# Patient Record
Sex: Male | Born: 1945 | Race: Black or African American | Hispanic: No | Marital: Married | Smoking: Former smoker
Health system: Southern US, Community
[De-identification: ages and names within clinical notes are randomized; demographics above are authoritative.]

## PROBLEM LIST (undated history)

## (undated) DIAGNOSIS — B1920 Unspecified viral hepatitis C without hepatic coma: Secondary | ICD-10-CM

## (undated) DIAGNOSIS — I639 Cerebral infarction, unspecified: Secondary | ICD-10-CM

## (undated) DIAGNOSIS — I10 Essential (primary) hypertension: Secondary | ICD-10-CM

## (undated) DIAGNOSIS — C61 Malignant neoplasm of prostate: Secondary | ICD-10-CM

## (undated) DIAGNOSIS — E785 Hyperlipidemia, unspecified: Secondary | ICD-10-CM

## (undated) DIAGNOSIS — M1990 Unspecified osteoarthritis, unspecified site: Secondary | ICD-10-CM

## (undated) HISTORY — DX: Essential (primary) hypertension: I10

## (undated) HISTORY — DX: Unspecified viral hepatitis C without hepatic coma: B19.20

## (undated) HISTORY — DX: Cerebral infarction, unspecified: I63.9

## (undated) HISTORY — DX: Hyperlipidemia, unspecified: E78.5

## (undated) HISTORY — DX: Unspecified osteoarthritis, unspecified site: M19.90

---

## 2008-01-14 ENCOUNTER — Emergency Department: Payer: Self-pay | Admitting: Emergency Medicine

## 2010-05-19 ENCOUNTER — Ambulatory Visit: Payer: Self-pay | Admitting: Internal Medicine

## 2011-06-22 ENCOUNTER — Ambulatory Visit: Payer: Self-pay | Admitting: Gastroenterology

## 2011-06-24 LAB — PATHOLOGY REPORT

## 2011-11-18 ENCOUNTER — Ambulatory Visit: Payer: Self-pay | Admitting: Physical Medicine and Rehabilitation

## 2014-06-18 DIAGNOSIS — M5416 Radiculopathy, lumbar region: Secondary | ICD-10-CM | POA: Insufficient documentation

## 2014-06-19 DIAGNOSIS — M5136 Other intervertebral disc degeneration, lumbar region: Secondary | ICD-10-CM | POA: Insufficient documentation

## 2015-12-20 DIAGNOSIS — I639 Cerebral infarction, unspecified: Secondary | ICD-10-CM | POA: Insufficient documentation

## 2015-12-20 DIAGNOSIS — I10 Essential (primary) hypertension: Secondary | ICD-10-CM | POA: Insufficient documentation

## 2015-12-27 ENCOUNTER — Other Ambulatory Visit: Payer: Self-pay | Admitting: Family Medicine

## 2015-12-27 DIAGNOSIS — I639 Cerebral infarction, unspecified: Secondary | ICD-10-CM

## 2016-01-06 ENCOUNTER — Ambulatory Visit: Payer: Medicare Other | Attending: Physical Medicine and Rehabilitation | Admitting: Physical Therapy

## 2016-01-06 ENCOUNTER — Encounter: Payer: Self-pay | Admitting: Physical Therapy

## 2016-01-06 DIAGNOSIS — M6281 Muscle weakness (generalized): Secondary | ICD-10-CM | POA: Insufficient documentation

## 2016-01-06 NOTE — Therapy (Signed)
[place] MAIN [facility] SERVICES [address] [place], [place], [address] Phone: [phone]   Fax:  [phone]  Physical Therapy Evaluation  Patient Details  Name: Francisco Lowe MRN: [id number] Date of Birth: November 25, 1945 Referring Provider: Dr. Gwyneth Revels  Encounter Date: 01/06/2016      PT End of Session - 01/06/16 1653    Visit Number 1   Number of Visits 25   Date for PT Re-Evaluation 2016-04-02   Authorization Type g codes   PT Start Time 0445   PT Stop Time 0530   PT Time Calculation (min) 45 min   Equipment Utilized During Treatment Gait belt      No past medical history on file.  No past surgical history on file.  There were no vitals filed for this visit.  Visit Diagnosis:  Muscle weakness (generalized)      Subjective Assessment - 01/06/16 1649    Subjective Patient is feeling somewhat dizzy and he is not having trouble with walking.  Left arm is numb.    Currently in Pain? No/denies            [facility] PT Assessment - 01/06/16 0001    Assessment   Medical Diagnosis cva   Referring Provider Dr. Gwyneth Revels   Onset Date/Surgical Date 12/25/15   Hand Dominance Right   Precautions   Precaution Comments hospital   Restrictions   Weight Bearing Restrictions No   Balance Screen   Has the patient fallen in the past 6 months No   Has the patient had a decrease in activity level because of a fear of falling?  Yes   Is the patient reluctant to leave their home because of a fear of falling?  No   Home Environment   Living Environment Private residence   Living Arrangements Spouse/significant other   Available Help at Discharge Family   Type of [place] to enter   Entrance Stairs-Number of Steps 5   Entrance Stairs-Rails Can reach both;Left;Right   Home Layout One level   [place] None   Prior Function   Level of Independence Independent;Independent with homemaking with ambulation   Vocation Retired       PAIN:  no reports of pain  POSTURE: WNL PROM/AROM: WFL  STRENGTH:  Graded on a 0-5 scale Muscle Group Left Right  Shoulder flex    Shoulder Abd    Shoulder Ext    Shoulder IR/ER    Elbow    Wrist/hand    Hip Flex 4 5  Hip Abd 4 5  Hip Add 4 5  Hip Ext 4 5  Hip IR/ER 4 5  Knee Flex 4 5  Knee Ext 4 5  Ankle DF 4 5  Ankle PF 4 5   SENSATION: LUE numbness   FUNCTIONAL MOBILITY: independent   BALANCE: Unable to tandem stand or single leg stand GAIT: Ambulates without AD with forward lean gait and uneven path  OUTCOME MEASURES: TEST Outcome Interpretation  5 times sit<>stand 20.90sec >60 yo, >15 sec indicates increased risk for falls  10 meter walk test  .9 0               m/s <1.0 m/s indicates increased risk for falls; limited community ambulator  Timed up and Go 14.22                sec <14 sec indicates increased risk for falls  6 minute walk test  900         Feet 1000 feet is community ambulator                                 PT Education - 02-02-16 1652    Education provided Yes   Education Details plan of care   Person(s) Educated Patient   Methods Explanation   Comprehension Verbalized understanding             PT Long Term Goals - 2016/02/02 1705    PT LONG TERM GOAL #1   Title Patient will tolerate 5 seconds of single leg stance without loss of balance to improve ability to get in and out of shower safely   Time 12   Period Weeks   Status New   PT LONG TERM GOAL #2   Title Patient will increase 10 meter walk test to >1.49m/s as to improve gait speed for better community ambulation and to reduce fall risk   Time 12   Period Weeks   Status New   PT LONG TERM GOAL #3   Title Patient will reduce timed up and go to <11 seconds to reduce fall risk and demonstrate improved transfer/gait ability   Time 12   Status New   PT LONG TERM GOAL #4   Title Patient will increase six minute walk test distance  to >1000 for progression to community ambulator and improve gait ability   Time 12   Period Weeks   Status New               Plan - 02-Feb-2016 1653    Clinical Impression Statement Patient has LE weakness and decreased ambulation for long distances, and decreased dynamic standing balance. He will benefit from skilled PT to improve dynamic stanidng balance and strength.    Pt will benefit from skilled therapeutic intervention in order to improve on the following deficits Decreased safety awareness;Abnormal gait;Decreased strength;Decreased balance   Rehab Potential Good   PT Frequency 2x / week   PT Duration 12 weeks   PT Treatment/Interventions Therapeutic exercise;Therapeutic activities;Gait training;Balance training   PT Next Visit Plan balance   Consulted and Agree with Plan of Care Patient          G-Codes - Feb 02, 2016 1709    Functional Assessment Tool Used 6 mw, TUG, 10 MW, 5 x sit to stand   Functional Limitation Mobility: Walking and moving around   Mobility: Walking and Moving Around Current Status [phone]) At least 20 percent but less than 40 percent impaired, limited or restricted   Mobility: Walking and Moving Around Goal Status [phone]) At least 1 percent but less than 20 percent impaired, limited or restricted       Problem List There are no active problems to display for this patient.  Alanson Puls, PT, DPT [place], [place] S 02/02/16, 5:15 PM  [place] MAIN [facility] SERVICES [address] [place], [place], [address] Phone: [phone]   Fax:  [phone]  Name: Francisco Lowe MRN: [id number] Date of Birth: 1945/12/29

## 2016-01-08 ENCOUNTER — Encounter: Payer: Self-pay | Admitting: Physical Therapy

## 2016-01-08 ENCOUNTER — Ambulatory Visit: Payer: Medicare Other | Admitting: Physical Therapy

## 2016-01-08 DIAGNOSIS — M6281 Muscle weakness (generalized): Secondary | ICD-10-CM | POA: Diagnosis not present

## 2016-01-08 NOTE — Therapy (Signed)
[place] MAIN [facility] SERVICES [address] [place], [place], [address] Phone: [phone]   Fax:  [phone]  Physical Therapy Treatment  Patient Details  Name: Francisco Lowe MRN: [id number] Date of Birth: 01-02-1946 Referring Provider: Dr. Gwyneth Revels  Encounter Date: 01/08/2016      PT End of Session - 01/08/16 1016    Visit Number 2   Number of Visits 25   Date for PT Re-Evaluation Apr 24, 2016   Authorization Type g codes   PT Start Time 01/30/1004   PT Stop Time 1045   PT Time Calculation (min) 40 min   Equipment Utilized During Treatment Gait belt      History reviewed. No pertinent past medical history.  History reviewed. No pertinent past surgical history.  There were no vitals filed for this visit.  Visit Diagnosis:  Muscle weakness (generalized)      Subjective Assessment - 01/08/16 1016    Subjective Patient is feeling somewhat dizzy and he is not having trouble with walking.  Left arm is numb.    Currently in Pain? No/denies     Therapeutic exercise: TM walking side stepping left and right x 5 minutes at . 7 miles/ hour Leg press 120 lbs x 15 x 3, ankle PF on leg press x 15 x 3 UBE x 5 minutes L 4 Side steps x 3 laps   Mini squat 2x10 Standing hip abd 2x10 Standing ankle PF/ DF 2x20 Patient needs occasional verbal cueing to improve posture and cueing to correctly perform exercises slowly, holding at end of range to increase motor firing of desired muscle to encourage fatigue.                            PT Education - 01/08/16 1016    Education provided Yes   Education Details plan of care   Person(s) Educated Patient   Methods Explanation   Comprehension Verbalized understanding             PT Long Term Goals - 01/06/16 1705    PT LONG TERM GOAL #1   Title Patient will tolerate 5 seconds of single leg stance without loss of balance to improve ability to get in and out of shower safely    Time 12   Period Weeks   Status New   PT LONG TERM GOAL #2   Title Patient will increase 10 meter walk test to >1.18m/s as to improve gait speed for better community ambulation and to reduce fall risk   Time 12   Period Weeks   Status New   PT LONG TERM GOAL #3   Title Patient will reduce timed up and go to <11 seconds to reduce fall risk and demonstrate improved transfer/gait ability   Time 12   Status New   PT LONG TERM GOAL #4   Title Patient will increase six minute walk test distance to >1000 for progression to community ambulator and improve gait ability   Time 12   Period Weeks   Status New               Plan - 01/08/16 1017    Clinical Impression Statement Patient is able to perform UE/LE exercises without pain behaviors and reports some fatigue.    Pt will benefit from skilled therapeutic intervention in order to improve on the following deficits Decreased safety awareness;Abnormal gait;Decreased strength;Decreased balance   Rehab Potential Good  PT Frequency 2x / week   PT Duration 12 weeks   PT Treatment/Interventions Therapeutic exercise;Therapeutic activities;Gait training;Balance training   PT Next Visit Plan balance   Consulted and Agree with Plan of Care Patient        Problem List There are no active problems to display for this patient. Alanson Puls, PT, DPT  [place], [place] S 01/08/2016, 10:19 AM  [place] MAIN [facility] SERVICES [address] [place], [place], [address] Phone: [phone]   Fax:  [phone]  Name: ROREY RAUF MRN: [id number] Date of Birth: 04-09-1946

## 2016-01-13 ENCOUNTER — Ambulatory Visit: Payer: Medicare Other | Admitting: Physical Therapy

## 2016-01-13 ENCOUNTER — Encounter: Payer: Self-pay | Admitting: Physical Therapy

## 2016-01-13 DIAGNOSIS — M6281 Muscle weakness (generalized): Secondary | ICD-10-CM

## 2016-01-13 NOTE — Therapy (Signed)
[place] MAIN [facility] SERVICES [address] [place], [place], [address] Phone: [phone]   Fax:  [phone]  Physical Therapy Treatment  Patient Details  Name: Francisco Lowe MRN: [id number] Date of Birth: Dec 26, 1945 Referring Provider: Dr. Gwyneth Revels  Encounter Date: 01/13/2016      PT End of Session - 01/13/16 1449    Visit Number 3   Number of Visits 25   Date for PT Re-Evaluation Apr 09, 2016   Authorization Type g codes   PT Start Time [id number]   PT Stop Time 1424   PT Time Calculation (min) 28 min   Equipment Utilized During Treatment Gait belt      History reviewed. No pertinent past medical history.  History reviewed. No pertinent past surgical history.  There were no vitals filed for this visit.      Subjective Assessment - 01/13/16 1358    Subjective Pt reports he has been doing well. His main issue continues to be the L leg numbness and weakness. He has had no falls since last visit. He went to his doctor last week and reported he doesn't like how his leg cramp medicine makes him feel dizzy. MD changed the medication to taking it at night. Pt says symptoms are better but not quick right.   Currently in Pain? No/denies      Warm up: Nustep L4 x 3 minutes (unbilled)  Balance: Ladder walk: forwards, L/R x2 each without UE support with CGA Step up/down onto BOSU ball (blue side) without UE support with min A Cues for increased BOS, postural correction and weight shifting  Therex:  Standing hip flexion, abd, ext with 10# 2x10 L LE Heel raises x 20 Mini squats x 15 Cues for proper technique of each exercises and positioning.                           PT Education - 01/13/16 1429    Education provided Yes   Education Details Importance of strengthening to improve balance   Person(s) Educated Patient   Methods Explanation   Comprehension Verbalized understanding             PT Long Term  Goals - 01/06/16 1705    PT LONG TERM GOAL #1   Title Patient will tolerate 5 seconds of single leg stance without loss of balance to improve ability to get in and out of shower safely   Time 12   Period Weeks   Status New   PT LONG TERM GOAL #2   Title Patient will increase 10 meter walk test to >1.53m/s as to improve gait speed for better community ambulation and to reduce fall risk   Time 12   Period Weeks   Status New   PT LONG TERM GOAL #3   Title Patient will reduce timed up and go to <11 seconds to reduce fall risk and demonstrate improved transfer/gait ability   Time 12   Status New   PT LONG TERM GOAL #4   Title Patient will increase six minute walk test distance to >1000 for progression to community ambulator and improve gait ability   Time 12   Period Weeks   Status New               Plan - 01/13/16 1452    Clinical Impression Statement Pt required CGA to min A during dynamic balance activities but was able to improve stability with repetition  and cues. Lower level balance activities completed with minimal difficulty, requiring higher level balance activities. L LE fatigues with strengthening exercises and required occasional rest breaks for energy conservation. He will benefit from continued strengthening and higher level balance tasks to progress towards PLOF.   Rehab Potential Good   PT Frequency 2x / week   PT Duration 12 weeks   PT Treatment/Interventions Therapeutic exercise;Therapeutic activities;Gait training;Balance training   PT Next Visit Plan increase LE strengthening, high level balance   Consulted and Agree with Plan of Care Patient      Patient will benefit from skilled therapeutic intervention in order to improve the following deficits and impairments:  Decreased safety awareness, Abnormal gait, Decreased strength, Decreased balance  Visit Diagnosis: Muscle weakness (generalized)     Problem List There are no active problems to display for  this patient.   Harvis Mabus Shiela Mayer, PT, DPT  01/13/2016, 3:30 PM [place] MAIN [facility] SERVICES [address] [place], [place], [address] Phone: [phone]   Fax:  [phone]  Name: Francisco Lowe MRN: [id number] Date of Birth: November 27, 1945

## 2016-01-15 ENCOUNTER — Encounter: Payer: Self-pay | Admitting: Physical Therapy

## 2016-01-15 ENCOUNTER — Ambulatory Visit: Payer: Medicare Other | Admitting: Physical Therapy

## 2016-01-15 DIAGNOSIS — M6281 Muscle weakness (generalized): Secondary | ICD-10-CM

## 2016-01-15 NOTE — Therapy (Addendum)
[place] MAIN [facility] SERVICES [address] [place], [place], [address] Phone: [phone]   Fax:  [phone]  Physical Therapy Treatment  Patient Details  Name: Francisco Lowe MRN: [id number] Date of Birth: 10/14/1945 Referring Provider: Dr. Gwyneth Revels  Encounter Date: 01/15/2016      PT End of Session - 01/15/16 1004    Visit Number 4   Number of Visits 25   Date for PT Re-Evaluation 2016/04/14   Authorization Type g codes   Authorization Time Period 4/10   PT Start Time 0920   PT Stop Time 1000   PT Time Calculation (min) 40 min   Equipment Utilized During Treatment Gait belt   Activity Tolerance Patient tolerated treatment well   Behavior During Therapy [facility] for tasks assessed/performed      History reviewed. No pertinent past medical history.  History reviewed. No pertinent past surgical history.  There were no vitals filed for this visit.      Subjective Assessment - 01/15/16 0926    Subjective Pt reports his dizziness has gotten better after MD adjusted his medication. L leg continues to have numbness.   Currently in Pain? No/denies     Therex: TM speed 1.5 x 5 min with frequent cues for postural correction including bringing hips forward underneath shoulders for upright stance. Pt able to correct with cues somewhat and reports tightness in anterior hips. + B Thomas test Standing hip flexor stretch in chair x30 sec each Prone hip flexor stretch with belt 3x30 sec each Standing hip abd with YTB L LE 3x10 Cues for proper technique of exercises and postural correction.   Neuro re-education SLS in corner 3x30 sec each with/without UE support OTIS on airex with YTB 3x20 sec R LE, task was too difficult for L LE L LE SLS without UE support 3x20 sec, attempted on airex and was too difficult at this time Cues for weight shifting, postural correction and glute contraction for increased stability. Pt required min A for balance  activities and had multiple LOB without self correction when in SLS.                            PT Education - 01/15/16 1003    Education provided Yes   Education Details HEP; role of hip muscles for stability and balance in SLS tasks   Person(s) Educated Patient   Methods Explanation;Demonstration;Tactile cues;Verbal cues;Handout   Comprehension Verbalized understanding;Returned demonstration             PT Long Term Goals - 01/06/16 1705    PT LONG TERM GOAL #1   Title Patient will tolerate 5 seconds of single leg stance without loss of balance to improve ability to get in and out of shower safely   Time 12   Period Weeks   Status New   PT LONG TERM GOAL #2   Title Patient will increase 10 meter walk test to >1.34m/s as to improve gait speed for better community ambulation and to reduce fall risk   Time 12   Period Weeks   Status New   PT LONG TERM GOAL #3   Title Patient will reduce timed up and go to <11 seconds to reduce fall risk and demonstrate improved transfer/gait ability   Time 12   Status New   PT LONG TERM GOAL #4   Title Patient will increase six minute walk test distance to >1000 for progression  to community ambulator and improve gait ability   Time 12   Period Weeks   Status New               Plan - 01/15/16 1005    Clinical Impression Statement Pt demonstrated tight hip flexors causing him to have a flexed posture. He also has significant L hip abductor weakness causing trendelenburg when in SLS on L LE. He will benefit from continued hip flexor stretching, hip strengthening and SLS balance tasks.   Rehab Potential Good   PT Frequency 2x / week   PT Duration 12 weeks   PT Treatment/Interventions Therapeutic exercise;Therapeutic activities;Gait training;Balance training   PT Next Visit Plan hip strengthening, SLS tasks, hip flexor stretching   PT Home Exercise Plan SLS in corner, hip flexor stretch   Consulted and Agree with  Plan of Care Patient      Patient will benefit from skilled therapeutic intervention in order to improve the following deficits and impairments:  Decreased safety awareness, Abnormal gait, Decreased strength, Decreased balance  Visit Diagnosis: Muscle weakness (generalized)     Problem List There are no active problems to display for this patient.   Alyah Boehning Shiela Mayer, PT, DPT  01/15/2016, 10:16 AM [phone]   [place] MAIN [facility] SERVICES [address] [place], [place], [address] Phone: [phone]   Fax:  [phone]  Name: Francisco Lowe MRN: [id number] Date of Birth: June 04, 1946

## 2016-01-15 NOTE — Patient Instructions (Signed)
Provided handout and reviewed HEP for hip flexor stretching and balance from [email or web address].com: prone hip flexor stretch with belt (or) standing kneeling in chair stretch, and corner SLS.

## 2016-01-20 ENCOUNTER — Ambulatory Visit
Admission: RE | Admit: 2016-01-20 | Discharge: 2016-01-20 | Disposition: A | Discharge status: Home / Self Care | Payer: Medicare Other | Source: Ambulatory Visit | Attending: Family Medicine | Admitting: Family Medicine

## 2016-01-20 DIAGNOSIS — I6782 Cerebral ischemia: Secondary | ICD-10-CM | POA: Diagnosis not present

## 2016-01-20 DIAGNOSIS — I639 Cerebral infarction, unspecified: Secondary | ICD-10-CM | POA: Insufficient documentation

## 2016-01-20 MED ORDER — GADOBENATE DIMEGLUMINE 529 MG/ML IV SOLN
20.0000 mL | Freq: Once | INTRAVENOUS | Status: AC | PRN
  Administered 2016-01-20: 16 mL via INTRAVENOUS

## 2016-01-21 ENCOUNTER — Ambulatory Visit: Payer: Medicare Other

## 2016-01-21 DIAGNOSIS — M6281 Muscle weakness (generalized): Secondary | ICD-10-CM | POA: Diagnosis not present

## 2016-01-21 NOTE — Therapy (Signed)
[place] MAIN [facility] SERVICES [address] [place], [place], [address] Phone: [phone]   Fax:  [phone]  Physical Therapy Treatment  Patient Details  Name: Francisco Lowe MRN: [id number] Date of Birth: 10-15-1945 Referring Provider: Dr. Gwyneth Revels  Encounter Date: 01/21/2016      PT End of Session - 01/21/16 1609    Visit Number 5   Number of Visits 25   Date for PT Re-Evaluation 2016/04/19   Authorization Type g codes   Authorization Time Period 5/10   PT Start Time 1604   PT Stop Time 1645   PT Time Calculation (min) 41 min   Equipment Utilized During Treatment Gait belt   Activity Tolerance Patient tolerated treatment well   Behavior During Therapy [facility] for tasks assessed/performed      History reviewed. No pertinent past medical history.  History reviewed. No pertinent past surgical history.  There were no vitals filed for this visit.      Subjective Assessment - 01/21/16 1609    Subjective pt reports he felt ok after last visit. he reports he has been sleepy   Patient Stated Goals improve mobility/balance    Currently in Pain? No/denies       therex:   Nustep L5 x 5 min no charge Agility ladder x 10 min various patterns with mod cues Side step with squat in Cable column 12.5lbs x 3 laps R/L retro walking with 7.5lbs x 5 laps in cable column  pt requires CGA for safety on balance exercises   Pt requires min verbal and tactile cues for proper exercise performance    NMR:  1/2 roll tandem balance 1 min x 3 on each leg  1/2 roll AP balance 1 min x 3  pt requires CGA for safety on balance exercises                              PT Long Term Goals - 01/06/16 1705    PT LONG TERM GOAL #1   Title Patient will tolerate 5 seconds of single leg stance without loss of balance to improve ability to get in and out of shower safely   Time 12   Period Weeks   Status New   PT LONG TERM GOAL #2   Title Patient will increase 10 meter walk test to >1.43m/s as to improve gait speed for better community ambulation and to reduce fall risk   Time 12   Period Weeks   Status New   PT LONG TERM GOAL #3   Title Patient will reduce timed up and go to <11 seconds to reduce fall risk and demonstrate improved transfer/gait ability   Time 12   Status New   PT LONG TERM GOAL #4   Title Patient will increase six minute walk test distance to >1000 for progression to community ambulator and improve gait ability   Time 12   Period Weeks   Status New               Plan - 01/21/16 1719    Clinical Impression Statement pt did well, but was faitgued with progression of therex and NMR today. has increased unsteadiness on the 1/2 roll. difficulty with retro walking   Rehab Potential Good   PT Frequency 2x / week   PT Duration 12 weeks   PT Treatment/Interventions Therapeutic exercise;Therapeutic activities;Gait training;Balance training   PT Next Visit Plan hip  strengthening, SLS tasks, hip flexor stretching   PT Home Exercise Plan SLS in corner, hip flexor stretch   Consulted and Agree with Plan of Care Patient      Patient will benefit from skilled therapeutic intervention in order to improve the following deficits and impairments:  Decreased safety awareness, Abnormal gait, Decreased strength, Decreased balance  Visit Diagnosis: Muscle weakness (generalized)     Problem List There are no active problems to display for this patient. [name]. Lavin Petteway, PT, DPT [phone]   Sylvan Sookdeo 01/21/2016, 5:20 PM  [place] MAIN [facility] SERVICES [address] [place], [place], [address] Phone: [phone]   Fax:  [phone]  Name: KAYLAN FENGER MRN: [id number] Date of Birth: 10/11/1945

## 2016-01-23 ENCOUNTER — Ambulatory Visit: Payer: Medicare Other

## 2016-01-23 DIAGNOSIS — M6281 Muscle weakness (generalized): Secondary | ICD-10-CM | POA: Diagnosis not present

## 2016-01-23 NOTE — Therapy (Signed)
[place] MAIN [facility] SERVICES [address] [place], [place], [address] Phone: [phone]   Fax:  [phone]  Physical Therapy Treatment  Patient Details  Name: Francisco Lowe MRN: [id number] Date of Birth: 1946/01/21 Referring Provider: Dr. Gwyneth Revels  Encounter Date: 01/23/2016      PT End of Session - 01/23/16 1650    Visit Number 6   Number of Visits 25   Date for PT Re-Evaluation Apr 08, 2016   Authorization Type g codes   Authorization Time Period 6/10   PT Start Time 1600   PT Stop Time 1644   PT Time Calculation (min) 44 min   Equipment Utilized During Treatment Gait belt   Activity Tolerance Patient tolerated treatment well   Behavior During Therapy [facility] for tasks assessed/performed      History reviewed. No pertinent past medical history.  History reviewed. No pertinent past surgical history.  There were no vitals filed for this visit.      Subjective Assessment - 01/23/16 1650    Subjective pt reports he was tired after last visit, but it felt good   Patient Stated Goals improve mobility/balance    Currently in Pain? No/denies      Therex: Nustep L6 x 4 min no charge Fwd/side/retro lunge x 10 each bilaterally. Pt requires min verbal and tactile cues for proper exercise performance   [place] 47m x 2 laps Over and back side to side and AP over line 30s x 2 each "quick feet"  NMR: Heel toe walking on 4in beam x 5 laps Retro walking heel/toe x 5 laps x 71ft Side stepping on beam x 3 laps Semi tandem on AIREX with head turns 90s both legs NBOS EO/EC on AIREX 1 min x 3  pt requires CGA for safety on balance exercises                             PT Education - 01/23/16 1650    Education provided Yes   Education Details tandem walking at home   Person(s) Educated Patient   Methods Explanation   Comprehension Verbalized understanding             PT Long Term Goals - 01/06/16 1705    PT LONG TERM GOAL #1   Title Patient will tolerate 5 seconds of single leg stance without loss of balance to improve ability to get in and out of shower safely   Time 12   Period Weeks   Status New   PT LONG TERM GOAL #2   Title Patient will increase 10 meter walk test to >1.25m/s as to improve gait speed for better community ambulation and to reduce fall risk   Time 12   Period Weeks   Status New   PT LONG TERM GOAL #3   Title Patient will reduce timed up and go to <11 seconds to reduce fall risk and demonstrate improved transfer/gait ability   Time 12   Status New   PT LONG TERM GOAL #4   Title Patient will increase six minute walk test distance to >1000 for progression to community ambulator and improve gait ability   Time 12   Period Weeks   Status New               Plan - 01/23/16 1651    Clinical Impression Statement pt continues to do well with dynamic and static balance. pt has trouble with  coordination tasks at times. pt needs short periods of rest. pt would benefit from continued skilled PT services to further progress strength balance and maximize mobility.    Rehab Potential Good   PT Frequency 2x / week   PT Duration 12 weeks   PT Treatment/Interventions Therapeutic exercise;Therapeutic activities;Gait training;Balance training   PT Next Visit Plan hip strengthening, SLS tasks, hip flexor stretching   PT Home Exercise Plan SLS in corner, hip flexor stretch   Consulted and Agree with Plan of Care Patient      Patient will benefit from skilled therapeutic intervention in order to improve the following deficits and impairments:  Decreased safety awareness, Abnormal gait, Decreased strength, Decreased balance  Visit Diagnosis: Muscle weakness (generalized)     Problem List There are no active problems to display for this patient.   Trula Frede 01/23/2016, 4:52 PM  [place] MAIN [facility] SERVICES [address]  [place], [place], [address] Phone: [phone]   Fax:  [phone]  Name: Francisco Lowe MRN: [id number] Date of Birth: Nov 08, 1945

## 2016-01-28 ENCOUNTER — Ambulatory Visit: Payer: Medicare Other

## 2016-01-28 DIAGNOSIS — M6281 Muscle weakness (generalized): Secondary | ICD-10-CM | POA: Diagnosis not present

## 2016-01-28 NOTE — Therapy (Signed)
[place] MAIN [name] SERVICES [address] [place], [place], [address] Phone: [phone]   Fax:  [phone]  Physical Therapy Treatment  Patient Details  Name: Francisco Lowe MRN: [id number] Date of Birth: Feb 23, 1946 Referring Provider: Dr. Gwyneth Revels  Encounter Date: 01/28/2016      PT End of Session - 01/28/16 1503    Visit Number 7   Number of Visits 25   Date for PT Re-Evaluation April 21, 2016   Authorization Type g codes   Authorization Time Period 7/10   PT Start Time 1350   PT Stop Time 1435   PT Time Calculation (min) 45 min   Equipment Utilized During Treatment Gait belt   Activity Tolerance Patient tolerated treatment well   Behavior During Therapy [name] for tasks assessed/performed      History reviewed. No pertinent past medical history.  History reviewed. No pertinent past surgical history.  There were no vitals filed for this visit.      Subjective Assessment - 01/28/16 1501    Subjective pt reports his L LE gets tired   Patient Stated Goals improve mobility/balance    Currently in Pain? No/denies      TM 3% grade 1.42mph x 2 min warm up Monster walk green band 24ft x 3 laps Side stepping green band 27ft x 3 laps BOSU squats flat up x 15 BOSU balance flat up 1 min x 2 Squats weighted 15lbs 2x10 Pt requires min verbal and tactile cues for proper exercise performance   Pt needs brief rest between sets                                PT Long Term Goals - 01/06/16 1705    PT LONG TERM GOAL #1   Title Patient will tolerate 5 seconds of single leg stance without loss of balance to improve ability to get in and out of shower safely   Time 12   Period Weeks   Status New   PT LONG TERM GOAL #2   Title Patient will increase 10 meter walk test to >1.62m/s as to improve gait speed for better community ambulation and to reduce fall risk   Time 12   Period Weeks   Status New   PT LONG TERM GOAL  #3   Title Patient will reduce timed up and go to <11 seconds to reduce fall risk and demonstrate improved transfer/gait ability   Time 12   Status New   PT LONG TERM GOAL #4   Title Patient will increase six minute walk test distance to >1000 for progression to community ambulator and improve gait ability   Time 12   Period Weeks   Status New               Plan - 01/28/16 1503    Clinical Impression Statement pt continues to progress well with therapy. more focus today on hip abd/ER strenghtening with associated HEP. pt continues to show good HEP compliance and motivation    Rehab Potential Good   PT Frequency 2x / week   PT Duration 12 weeks   PT Treatment/Interventions Therapeutic exercise;Therapeutic activities;Gait training;Balance training   PT Next Visit Plan hip strengthening, SLS tasks, hip flexor stretching   PT Home Exercise Plan SLS in corner, hip flexor stretch   Consulted and Agree with Plan of Care Patient      Patient will benefit from skilled  therapeutic intervention in order to improve the following deficits and impairments:  Decreased safety awareness, Abnormal gait, Decreased strength, Decreased balance  Visit Diagnosis: Muscle weakness (generalized)     Problem List There are no active problems to display for this patient.  [name]. [name], PT, DPT [phone]  [name] 01/28/2016, 3:09 PM  [name] MAIN [facility] SERVICES [address] [place], [place], [address] Phone: [phone]   Fax:  [phone]  Name: Francisco Lowe MRN: [id number] Date of Birth: November 18, 1945

## 2016-01-28 NOTE — Patient Instructions (Signed)
[email or web address] Side stepping with green band 56ft x 2 Monster walk with green band 46ft x 2 Hip hikes 2x10 BLE

## 2016-01-30 ENCOUNTER — Ambulatory Visit: Payer: Medicare Other

## 2016-01-30 DIAGNOSIS — M6281 Muscle weakness (generalized): Secondary | ICD-10-CM

## 2016-01-30 NOTE — Therapy (Signed)
[place] MAIN [facility] SERVICES [address] [place], [place], [address] Phone: [phone]   Fax:  [phone]  Physical Therapy Treatment  Patient Details  Name: Francisco Lowe MRN: [id number] Date of Birth: 1946/02/02 Referring Provider: Dr. Gwyneth Revels  Encounter Date: 01/30/2016      PT End of Session - 01/30/16 1451    Visit Number 8   Number of Visits 25   Date for PT Re-Evaluation April 15, 2016   Authorization Type g codes   Authorization Time Period 8/10   PT Start Time (p) 1445   PT Stop Time (p) 1515   PT Time Calculation (min) (p) 30 min   Equipment Utilized During Treatment Gait belt   Activity Tolerance Patient tolerated treatment well   Behavior During Therapy [facility] for tasks assessed/performed      No past medical history on file.  No past surgical history on file.  There were no vitals filed for this visit.      Subjective Assessment - 01/30/16 1450    Subjective pt apologizes for running 15 min late . no specific questions or concerns at this time   Patient Stated Goals improve mobility/balance    Currently in Pain? No/denies       Therex: Tball bridge 2x10 Tball superman in prone 2x10 Tball prone alt LE extension 2x10 LTR with Tball  Prone on elbows stretching x 41min Walking lunge fwd/ retro x 67ft each way (CGA for safety) Pt requires min verbal and tactile cues for proper exercise performance                             PT Education - 01/30/16 1450    Education provided Yes   Education Details back strengthening    Person(s) Educated Patient   Methods Explanation   Comprehension Verbalized understanding             PT Long Term Goals - 01/06/16 1705    PT LONG TERM GOAL #1   Title Patient will tolerate 5 seconds of single leg stance without loss of balance to improve ability to get in and out of shower safely   Time 12   Period Weeks   Status New   PT LONG TERM GOAL #2    Title Patient will increase 10 meter walk test to >1.79m/s as to improve gait speed for better community ambulation and to reduce fall risk   Time 12   Period Weeks   Status New   PT LONG TERM GOAL #3   Title Patient will reduce timed up and go to <11 seconds to reduce fall risk and demonstrate improved transfer/gait ability   Time 12   Status New   PT LONG TERM GOAL #4   Title Patient will increase six minute walk test distance to >1000 for progression to community ambulator and improve gait ability   Time 12   Period Weeks   Status New               Plan - 01/30/16 1525    Clinical Impression Statement PT progressed therex/HEP to include paraspinal, core , glute strengthening to aid with upright posture. pt is able to stand upright for short periods but then agan flexes at hips shifting his COG forward. pt shows good understadning of HEP.    Rehab Potential Good   PT Frequency 2x / week   PT Duration 12 weeks   PT  Treatment/Interventions Therapeutic exercise;Therapeutic activities;Gait training;Balance training   PT Next Visit Plan hip strengthening, SLS tasks, hip flexor stretching   PT Home Exercise Plan SLS in corner, hip flexor stretch   Consulted and Agree with Plan of Care Patient      Patient will benefit from skilled therapeutic intervention in order to improve the following deficits and impairments:  Decreased safety awareness, Abnormal gait, Decreased strength, Decreased balance  Visit Diagnosis: Muscle weakness (generalized)     Problem List There are no active problems to display for this patient.  [name]. Rashae Rother, PT, DPT [phone]  Leeann Bady 01/30/2016, 3:26 PM  [place] MAIN [facility] SERVICES [address] [place], [place], [address] Phone: [phone]   Fax:  [phone]  Name: EITO OLSAVSKY MRN: [id number] Date of Birth: 1945/12/15

## 2016-02-04 ENCOUNTER — Ambulatory Visit: Payer: Medicare Other | Attending: Physical Medicine and Rehabilitation

## 2016-02-04 DIAGNOSIS — M6281 Muscle weakness (generalized): Secondary | ICD-10-CM | POA: Diagnosis present

## 2016-02-04 NOTE — Therapy (Signed)
[place] MAIN [facility] SERVICES [address] [place], [place], [address] Phone: [phone]   Fax:  [phone]  Physical Therapy Treatment  Patient Details  Name: KARTIER ATER MRN: [id number] Date of Birth: 15-Dec-1945 Referring Provider: Dr. Gwyneth Revels  Encounter Date: 02/04/2016      PT End of Session - 02/04/16 1728    Visit Number 9   Number of Visits 25   Date for PT Re-Evaluation 04/24/16   Authorization Type g codes   Authorization Time Period 9/10   PT Start Time 1645   PT Stop Time 1725   PT Time Calculation (min) 40 min   Equipment Utilized During Treatment Gait belt   Activity Tolerance Patient tolerated treatment well   Behavior During Therapy [facility] for tasks assessed/performed      No past medical history on file.  No past surgical history on file.  There were no vitals filed for this visit.      Subjective Assessment - 02/04/16 1727    Subjective pt reports he is feeling good. he is doing new new HEP and trying to stand up straighter. he still has trouble with balance   Patient Stated Goals improve mobility/balance    Currently in Pain? No/denies          NMR: Over grassy surface: Sit to stand with 12lb med ball over head lift x 10 Fwd walking x 128ft min cues for foot clearance Side shuffle with cues to switch direction 1 min x 3 rounds 4 square step in pavement x 10 laps CW/CCW Fwd walking across grass with eyes closed 30ft x 5 Retro walking in grass eyes open 53ft x 3 180 deg turns in grass x 10 "simon says" in grass with cues for fwd fast/ slow / stop / reverse 22ft x 4 laps  pt requires CGA for safety on balance exercises                              PT Long Term Goals - 01/06/16 1705    PT LONG TERM GOAL #1   Title Patient will tolerate 5 seconds of single leg stance without loss of balance to improve ability to get in and out of shower safely   Time 12   Period Weeks   Status New   PT LONG TERM GOAL #2   Title Patient will increase 10 meter walk test to >1.23m/s as to improve gait speed for better community ambulation and to reduce fall risk   Time 12   Period Weeks   Status New   PT LONG TERM GOAL #3   Title Patient will reduce timed up and go to <11 seconds to reduce fall risk and demonstrate improved transfer/gait ability   Time 12   Status New   PT LONG TERM GOAL #4   Title Patient will increase six minute walk test distance to >1000 for progression to community ambulator and improve gait ability   Time 12   Period Weeks   Status New               Plan - 02/04/16 1729    Clinical Impression Statement PT worked with pt outdoors today primarily in the grass for uneven surface/ dynamic balance tasks with coordination. pt was challenged by this, however is important for him to return to PLOF which include yard work.    Rehab Potential Good   PT Frequency  2x / week   PT Duration 12 weeks   PT Treatment/Interventions Therapeutic exercise;Therapeutic activities;Gait training;Balance training   PT Next Visit Plan hip strengthening, SLS tasks, hip flexor stretching   PT Home Exercise Plan SLS in corner, hip flexor stretch   Consulted and Agree with Plan of Care Patient      Patient will benefit from skilled therapeutic intervention in order to improve the following deficits and impairments:  Decreased safety awareness, Abnormal gait, Decreased strength, Decreased balance  Visit Diagnosis: Muscle weakness (generalized)     Problem List There are no active problems to display for this patient.  [name]. Marysa Wessner, PT, DPT [phone]  Kieffer Blatz 02/04/2016, 5:30 PM  [place] MAIN [facility] SERVICES [address] [place], [place], [address] Phone: [phone]   Fax:  [phone]  Name: COHEN NOVACEK MRN: [id number] Date of Birth: 29-Aug-1946

## 2016-02-13 ENCOUNTER — Ambulatory Visit: Payer: Medicare Other

## 2016-02-17 DIAGNOSIS — Z8673 Personal history of transient ischemic attack (TIA), and cerebral infarction without residual deficits: Secondary | ICD-10-CM | POA: Insufficient documentation

## 2016-02-18 ENCOUNTER — Ambulatory Visit: Payer: Medicare Other

## 2016-02-20 ENCOUNTER — Ambulatory Visit: Payer: Medicare Other

## 2016-02-20 VITALS — BP 132/79 | HR 82

## 2016-02-20 DIAGNOSIS — M6281 Muscle weakness (generalized): Secondary | ICD-10-CM | POA: Diagnosis not present

## 2016-02-20 NOTE — Therapy (Signed)
[place] MAIN [facility] SERVICES [address] [place], [place], [address] Phone: [phone]   Fax:  [phone]  Physical Therapy Treatment  Patient Details  Name: Francisco Lowe MRN: [id number] Date of Birth: Dec 10, 1945 Referring Provider: Dr. Gwyneth Revels  Encounter Date: 02/20/2016      PT End of Session - 02/20/16 1452    Visit Number 10   Number of Visits 25   Date for PT Re-Evaluation April 18, 2016   Authorization Type g codes   Authorization Time Period 10/10   PT Start Time [id number]   PT Stop Time 1515   PT Time Calculation (min) 38 min   Equipment Utilized During Treatment Gait belt   Activity Tolerance Patient tolerated treatment well   Behavior During Therapy [facility] for tasks assessed/performed      History reviewed. No pertinent past medical history.  History reviewed. No pertinent past surgical history.  Filed Vitals:   02/20/16 1440  BP: 132/79  Pulse: 82  SpO2: 100%        Subjective Assessment - 02/20/16 1440    Subjective Pt states he is feeling well on this date. He had to miss his last appointment due to transportation issues. No pain today and no falls since last therapy session. States that MD cleared him to return to driving. No specific questions or concerns at this time.    Patient Stated Goals improve mobility/balance    Currently in Pain? No/denies            [facility] PT Assessment - 02/20/16 1506    ROM / Strength   AROM / PROM / Strength Strength   Strength   Overall Strength Deficits   Strength Assessment Site Hip;Knee;Ankle   Right/Left Hip Left   Left Hip Flexion 4/5   Right/Left Knee Left   Left Knee Flexion 5/5   Left Knee Extension 5/5   Right/Left Ankle Left   Left Ankle Dorsiflexion 4-/5   6 Minute Walk- Baseline   6 Minute Walk- Baseline --   6 Minute walk- Post Test   6 Minute Walk Post Test yes   Perceived Rate of Exertion (Borg) 12-   6 minute walk test results    Aerobic Endurance  Distance Walked 1550   Standardized Balance Assessment   Standardized Balance Assessment Timed Up and Go Test;10 meter walk test;Dynamic Gait Index   10 Meter Walk 1.69 m/s   Dynamic Gait Index   Level Surface Normal   Change in Gait Speed Normal   Gait with Horizontal Head Turns Normal   Gait with Vertical Head Turns Normal   Gait and Pivot Turn Normal   Step Over Obstacle Normal   Step Around Obstacles Normal   Steps Mild Impairment   Total Score 23   Timed Up and Go Test   Normal TUG (seconds) 7.9     Treatment  Neuromuscular Re-education Airex feet together eyes closed balance x 30 seconds; Airex single leg balance practice x 30 seconds total each leg, approximately 5 second bouts on LLE, and 10 second bouts on RLE; SAEBO exercises narrow stance Airex challenging forward reach, alternating UE and crossing midline; Sit to stand with 5kg med ball over head lift with feet together on airex 2 x 5; Airex balance beam forward and backward tandem gait x 6 lengths each; Airex balance beam side stepping with horizontal and vertical head turns x 8 lengths;  Physical Performance Performed DGI, single leg balance testing, TUG, 63m gait speed, and  6 MWT with patient; Updated goals and discussed plan of care with patient;                      PT Education - 01-Mar-2016 1441    Education provided Yes   Education Details HEP reinforced, added Tband resisted L dorsiflexion in sitting   Person(s) Educated Patient   Methods Explanation;Demonstration;Tactile cues;Verbal cues   Comprehension Verbalized understanding;Returned demonstration             PT Long Term Goals - 2016/03/01 1458    PT LONG TERM GOAL #1   Title Patient will tolerate 5 seconds of single leg stance without loss of balance to improve ability to get in and out of shower safely   Baseline 03-01-16: 3.4 seconds on LLE, approximately 9 seconds on RLE   Time 12   Period Weeks   Status On-going   PT LONG  TERM GOAL #2   Title Patient will increase 10 meter walk test to >1.3m/s as to improve gait speed for better community ambulation and to reduce fall risk   Baseline 03-01-2016: 1.69 m/s   Time 12   Period Weeks   Status Achieved   PT LONG TERM GOAL #3   Title Patient will reduce timed up and go to <11 seconds to reduce fall risk and demonstrate improved transfer/gait ability   Baseline 03/01/16: 7.9 seconds   Time 12   Status Achieved   PT LONG TERM GOAL #4   Title Patient will increase six minute walk test distance to >1000 for progression to community ambulator and improve gait ability   Baseline 2016/03/01: 1550'   Time 12   Period Weeks   Status Achieved               Plan - 03-01-16 1457    Clinical Impression Statement Pt demonstrates excellent progress with physical therapy on this date. He is able to meet all of his goals with the exception of single leg balance on LLE. Would recommend BERG on next date to assess for remaining balance deficits and add new goals to plan of care. Pt also presents with L hip flexion and ankle dorsiflexion weakness. Pt will continue to benefit from skilled PT services to address deficits in balance and strength in order to return to full function at home.    Rehab Potential Good   PT Frequency 2x / week   PT Duration 12 weeks   PT Treatment/Interventions Therapeutic exercise;Therapeutic activities;Gait training;Balance training   PT Next Visit Plan hip strengthening, SLS tasks, hip flexor stretching   PT Home Exercise Plan SLS in corner, hip flexor stretch, seated L ankle theraband resisted dorsiflexion    Consulted and Agree with Plan of Care Patient      Patient will benefit from skilled therapeutic intervention in order to improve the following deficits and impairments:  Decreased safety awareness, Abnormal gait, Decreased strength, Decreased balance  Visit Diagnosis: Muscle weakness (generalized)       G-Codes - 03/01/2016 1613     Functional Assessment Tool Used 6 mw, TUG, 10 MW,   Functional Limitation Mobility: Walking and moving around   Mobility: Walking and Moving Around Current Status [id number]) At least 1 percent but less than 20 percent impaired, limited or restricted   Mobility: Walking and Moving Around Goal Status [id number]) At least 1 percent but less than 20 percent impaired, limited or restricted      Problem List There are no active problems  to display for this patient.  Phillips Grout PT, DPT   Francisco Lowe 02/20/2016, 4:19 PM  [place] MAIN [facility] SERVICES [address] [place], [place], [address] Phone: [phone]   Fax:  [phone]  Name: Francisco Lowe MRN: [id number] Date of Birth: January 09, 1946

## 2016-02-25 ENCOUNTER — Ambulatory Visit: Payer: Medicare Other

## 2016-02-25 DIAGNOSIS — M6281 Muscle weakness (generalized): Secondary | ICD-10-CM | POA: Diagnosis not present

## 2016-02-25 NOTE — Therapy (Signed)
[place] MAIN [facility] SERVICES [address] [place], [place], [address] Phone: [phone]   Fax:  [phone]  Physical Therapy Treatment  Patient Details  Name: Francisco Lowe MRN: [id number] Date of Birth: August 14, 1946 Referring Provider: Dr. Gwyneth Revels  Encounter Date: 02/25/2016      PT End of Session - 02/25/16 1651    Visit Number 11   Number of Visits 25   Date for PT Re-Evaluation 2016/04/21   Authorization Type g codes   Authorization Time Period 2/10   PT Start Time 1600   PT Stop Time 1645   PT Time Calculation (min) 45 min   Equipment Utilized During Treatment Gait belt   Activity Tolerance Patient tolerated treatment well   Behavior During Therapy [facility] for tasks assessed/performed      History reviewed. No pertinent past medical history.  History reviewed. No pertinent past surgical history.  There were no vitals filed for this visit.      Subjective Assessment - 02/25/16 1650    Subjective pt reports he has been doing very well. he has had no falls.    Patient Stated Goals improve mobility/balance    Currently in Pain? No/denies        NMR: TM walking 3% grade 2.24mph x 3 min no chargewarm up Fwd/side and retro lunge x 10 each, BLE pt needs min-mod cues for weight shift Agility ladder x 8 min with cues to increase speed BOSU mini squat x 10 BOSU SS weight shift x 20 BOSU AP weight shift in staggered stance x 20 Ball bounce/toss in hallway fwd / retro 40ft x 2 [place] in hallway x 13ft each way  pt requires CGA for safety on balance exercises                                PT Long Term Goals - 02/20/16 1458    PT LONG TERM GOAL #1   Title Patient will tolerate 5 seconds of single leg stance without loss of balance to improve ability to get in and out of shower safely   Baseline 02/20/16: 3.4 seconds on LLE, approximately 9 seconds on RLE   Time 12   Period Weeks   Status On-going    PT LONG TERM GOAL #2   Title Patient will increase 10 meter walk test to >1.58m/s as to improve gait speed for better community ambulation and to reduce fall risk   Baseline 02/20/16: 1.69 m/s   Time 12   Period Weeks   Status Achieved   PT LONG TERM GOAL #3   Title Patient will reduce timed up and go to <11 seconds to reduce fall risk and demonstrate improved transfer/gait ability   Baseline 02/20/16: 7.9 seconds   Time 12   Status Achieved   PT LONG TERM GOAL #4   Title Patient will increase six minute walk test distance to >1000 for progression to community ambulator and improve gait ability   Baseline 02/20/16: 1550'   Time 12   Period Weeks   Status Achieved               Plan - 02/25/16 1651    Clinical Impression Statement pt scored 54/65 on berg balance test. He reports no difficulties at home at this time. plan for DC from PT after next visit with progressed HEP   Rehab Potential Good   PT Frequency 2x / week  PT Duration 12 weeks   PT Treatment/Interventions Therapeutic exercise;Therapeutic activities;Gait training;Balance training   PT Next Visit Plan hip strengthening, SLS tasks, hip flexor stretching   PT Home Exercise Plan SLS in corner, hip flexor stretch, seated L ankle theraband resisted dorsiflexion    Consulted and Agree with Plan of Care Patient      Patient will benefit from skilled therapeutic intervention in order to improve the following deficits and impairments:  Decreased safety awareness, Abnormal gait, Decreased strength, Decreased balance  Visit Diagnosis: Muscle weakness (generalized)     Problem List There are no active problems to display for this patient.  [name]. Julianne Chamberlin, PT, DPT [phone]  Deloise Marchant 02/25/2016, 4:54 PM  [place] MAIN [facility] SERVICES [address] [place], [place], [address] Phone: [phone]   Fax:  [phone]  Name: Francisco Lowe MRN: [id number] Date of  Birth: September 30, 1946

## 2016-02-27 ENCOUNTER — Ambulatory Visit: Payer: Medicare Other

## 2016-02-27 DIAGNOSIS — M6281 Muscle weakness (generalized): Secondary | ICD-10-CM

## 2016-02-27 NOTE — Therapy (Signed)
[place] MAIN [facility] SERVICES [address] [place], [place], [address] Phone: [phone]   Fax:  [phone]  Physical Therapy Treatment / Discharge note  Patient Details  Name: Francisco Lowe MRN: [id number] Date of Birth: September 06, 1946 Referring Provider: Dr. Gwyneth Lowe  Encounter Date: 02/27/2016      PT End of Session - 02/27/16 1706    Visit Number 12   Number of Visits 25   Date for PT Re-Evaluation April 13, 2016   Authorization Type g codes   Authorization Time Period 3/10   PT Start Time 1600   PT Stop Time 1640   PT Time Calculation (min) 40 min   Equipment Utilized During Treatment Gait belt   Activity Tolerance Patient tolerated treatment well   Behavior During Therapy [facility] for tasks assessed/performed      History reviewed. No pertinent past medical history.  History reviewed. No pertinent past surgical history.  There were no vitals filed for this visit.      Subjective Assessment - 02/27/16 1700    Subjective pt reports he feels confident with HEP. he does request some exercises for his posture that he can do at home and also strengthen the L shoulder.    Patient Stated Goals improve mobility/balance                  therex: PT briefly performed L shoulder screen  pt demonstrates scapular winging and periscapular weakness 4-/5, deltoid is 5/5   Prone YTI 3 x 10 each Bent over row 10lbs 3x  10 Serratus punch 10lbs 3 x 10 Low row red band 3 x 10 Wall slide with lift off red band x 10                    PT Long Term Goals - 02/27/16 1707    PT LONG TERM GOAL #1   Title Patient will tolerate 5 seconds of single leg stance without loss of balance to improve ability to get in and out of shower safely   Baseline 02/20/16: 3.4 seconds on LLE, approximately 9 seconds on RLE   Time 12   Period Weeks   Status On-going   PT LONG TERM GOAL #2   Title Patient will increase 10 meter walk test to  >1.41m/s as to improve gait speed for better community ambulation and to reduce fall risk   Baseline 02/20/16: 1.69 m/s   Time 12   Period Weeks   Status Achieved   PT LONG TERM GOAL #3   Title Patient will reduce timed up and go to <11 seconds to reduce fall risk and demonstrate improved transfer/gait ability   Baseline 02/20/16: 7.9 seconds   Time 12   Status Achieved   PT LONG TERM GOAL #4   Title Patient will increase six minute walk test distance to >1000 for progression to community ambulator and improve gait ability   Baseline 02/20/16: 1550'   Time 12   Period Weeks   Status Achieved               Plan - 02/27/16 1706    Clinical Impression Statement pt has achieved PT goals at this time and will be DC from therapy after todays visit after making outstanding progress. pt feels confident with independent HEP   Rehab Potential Good   PT Frequency 2x / week   PT Duration 12 weeks   PT Treatment/Interventions Therapeutic exercise;Therapeutic activities;Gait training;Balance training   PT Next  Visit Plan hip strengthening, SLS tasks, hip flexor stretching   PT Home Exercise Plan SLS in corner, hip flexor stretch, seated L ankle theraband resisted dorsiflexion    Consulted and Agree with Plan of Care Patient      Patient will benefit from skilled therapeutic intervention in order to improve the following deficits and impairments:  Decreased safety awareness, Abnormal gait, Decreased strength, Decreased balance  Visit Diagnosis: Muscle weakness (generalized)       G-Codes - 2016/03/12 1707    Functional Assessment Tool Used Berg/46mwalk   Functional Limitation Mobility: Walking and moving around   Mobility: Walking and Moving Around Current Status [phone]) At least 1 percent but less than 20 percent impaired, limited or restricted   Mobility: Walking and Moving Around Goal Status [phone]) At least 1 percent but less than 20 percent impaired, limited or restricted    Mobility: Walking and Moving Around Discharge Status [phone]) At least 1 percent but less than 20 percent impaired, limited or restricted      Problem List There are no active problems to display for this patient.  Francisco Lowe, PT, DPT [phone]  Francisco Lowe 03/12/16, 5:08 PM  [place] MAIN [facility] SERVICES [address] [place], [place], [address] Phone: [phone]   Fax:  [phone]  Name: Francisco Lowe MRN: [id number] Date of Birth: [birth year]

## 2016-02-27 NOTE — Patient Instructions (Signed)
[email or web address] Prone YTI 3 x 10 each Bent over row 10lbs 3x  10 Serratus punch 10lbs 3 x 10 Low row red band 3 x 10 Wall slide with lift off red band x 10

## 2016-03-03 ENCOUNTER — Ambulatory Visit: Payer: Medicare (Managed Care)

## 2016-03-05 ENCOUNTER — Ambulatory Visit: Payer: Medicare (Managed Care)

## 2016-06-19 DIAGNOSIS — Z0000 Encounter for general adult medical examination without abnormal findings: Secondary | ICD-10-CM | POA: Insufficient documentation

## 2016-12-01 ENCOUNTER — Other Ambulatory Visit: Payer: Self-pay | Admitting: Family Medicine

## 2016-12-01 DIAGNOSIS — Z8673 Personal history of transient ischemic attack (TIA), and cerebral infarction without residual deficits: Secondary | ICD-10-CM

## 2016-12-03 ENCOUNTER — Other Ambulatory Visit: Payer: Self-pay

## 2016-12-03 DIAGNOSIS — R972 Elevated prostate specific antigen [PSA]: Secondary | ICD-10-CM

## 2016-12-03 DIAGNOSIS — C61 Malignant neoplasm of prostate: Secondary | ICD-10-CM

## 2016-12-03 HISTORY — DX: Malignant neoplasm of prostate: C61

## 2016-12-04 ENCOUNTER — Ambulatory Visit
Admission: RE | Admit: 2016-12-04 | Discharge: 2016-12-04 | Disposition: A | Discharge status: Home / Self Care | Payer: Medicare Other | Source: Ambulatory Visit | Attending: Family Medicine | Admitting: Family Medicine

## 2016-12-04 ENCOUNTER — Other Ambulatory Visit: Payer: Self-pay

## 2016-12-04 DIAGNOSIS — Z8673 Personal history of transient ischemic attack (TIA), and cerebral infarction without residual deficits: Secondary | ICD-10-CM | POA: Insufficient documentation

## 2016-12-04 MED ORDER — GADOBENATE DIMEGLUMINE 529 MG/ML IV SOLN
20.0000 mL | Freq: Once | INTRAVENOUS | Status: AC | PRN
  Administered 2016-12-04: 16 mL via INTRAVENOUS

## 2016-12-07 ENCOUNTER — Telehealth: Payer: Self-pay | Admitting: Radiology

## 2016-12-07 ENCOUNTER — Encounter: Payer: Self-pay | Admitting: Urology

## 2016-12-07 ENCOUNTER — Ambulatory Visit (INDEPENDENT_AMBULATORY_CARE_PROVIDER_SITE_OTHER): Payer: Medicare Other | Admitting: Urology

## 2016-12-07 VITALS — BP 145/88 | HR 83 | Ht 71.0 in | Wt 148.3 lb

## 2016-12-07 DIAGNOSIS — C61 Malignant neoplasm of prostate: Secondary | ICD-10-CM

## 2016-12-07 DIAGNOSIS — R634 Abnormal weight loss: Secondary | ICD-10-CM

## 2016-12-07 DIAGNOSIS — N529 Male erectile dysfunction, unspecified: Secondary | ICD-10-CM | POA: Insufficient documentation

## 2016-12-07 DIAGNOSIS — I10 Essential (primary) hypertension: Secondary | ICD-10-CM | POA: Insufficient documentation

## 2016-12-07 DIAGNOSIS — B1920 Unspecified viral hepatitis C without hepatic coma: Secondary | ICD-10-CM | POA: Insufficient documentation

## 2016-12-07 DIAGNOSIS — R748 Abnormal levels of other serum enzymes: Secondary | ICD-10-CM | POA: Diagnosis not present

## 2016-12-07 DIAGNOSIS — G8929 Other chronic pain: Secondary | ICD-10-CM | POA: Insufficient documentation

## 2016-12-07 DIAGNOSIS — M545 Low back pain: Secondary | ICD-10-CM

## 2016-12-07 MED ORDER — DEGARELIX ACETATE 120 MG ~~LOC~~ SOLR
240.0000 mg | Freq: Once | SUBCUTANEOUS | Status: AC
  Administered 2016-12-07: 240 mg via SUBCUTANEOUS

## 2016-12-07 NOTE — Telephone Encounter (Signed)
Notified pt of prostate biopsy in office on 12/16/16 & to arrive at 1:15. Also notified pt of results appt on 12/24/16 @11 :00. Per Dr Raylene Miyamoto instruction, advised pt to hold ASA 81mg  beginning on 12/09/16 with the understanding that there is an increased risk of stroke associated with stopping the medication. Advised pt to resume ASA after procedure. Questions were answered. Pt voices understanding.

## 2016-12-07 NOTE — Progress Notes (Signed)
12/07/2016 8:42 PM   Francisco Lowe 11/09/1945 [id number]  Referring provider: Dion Body, MD [place] [facility] [place], [place] [address]  Chief Complaint  Patient presents with  . Elevated PSA    HPI: 71 yo M Referred by his PCP, Dr. Netty Starring, further evaluation of markedly elevated PSA.  PSA drawn on 12/01/2016 was noted to be greater than 150. This was repeated on 12/03/2016 which was 362.9.  He is also noted to have elevated alkaline phosphatase at 414 which was previously not elevated in 06/2016.    He does not recall ever being told about elevated PSA in the past. He thinks that this may have been his first PSA lab screening. He denies any previous abnormal rectal exams.  He does have a family history of prostate cancer including possibly his brother.    He does get up x2-3 at night.  He does feel that he is able to empty his bladder a good stream.  He does have urgency without incontinence over the past few months.  No history of UTIs or stones.  No gross hematuria.  He has lost 20 lbs over the past few months.  He does also lower back pain which is chronic.    He does have baseline ED and has used Viagra in the past.     PMH: Past Medical History:  Diagnosis Date  . Arthritis   . Hepatitis C   . Hyperlipidemia   . Hypertension   . Stroke [facility])     Surgical History: No past surgical history on file.  Home Medications:  Allergies as of 12/07/2016   No Known Allergies     Medication List       Accurate as of 12/07/16  8:42 PM. Always use your most recent med list.          aspirin EC 81 MG tablet Take 81 mg by mouth.   atorvastatin 40 MG tablet Commonly known as:  LIPITOR Take 40 mg by mouth.   cyclobenzaprine 10 MG tablet Commonly known as:  FLEXERIL TAKE ONE TABLET BY MOUTH AT NIGHT AS NEEDED FOR MUSCLE SPASMS   gabapentin 300 MG capsule Commonly known as:  NEURONTIN Take by mouth.   HYDROcodone-acetaminophen  7.5-325 MG tablet Commonly known as:  NORCO Take by mouth.   lisinopril-hydrochlorothiazide 20-12.5 MG tablet Commonly known as:  PRINZIDE,ZESTORETIC Take by mouth.   mirtazapine 15 MG tablet Commonly known as:  REMERON Take 7.5 mg by mouth.       Allergies: No Known Allergies  Family History: Family History  Problem Relation Age of Onset  . Prostate cancer Neg Hx   . Bladder Cancer Neg Hx   . Kidney cancer Neg Hx     Social History:  reports that he has quit smoking. He has never used smokeless tobacco. His alcohol and drug histories are not on file.  ROS: UROLOGY Frequent Urination?: Yes Hard to postpone urination?: Yes Burning/pain with urination?: No Get up at night to urinate?: Yes Leakage of urine?: Yes Urine stream starts and stops?: No Trouble starting stream?: No Do you have to strain to urinate?: No Blood in urine?: No Urinary tract infection?: No Sexually transmitted disease?: No Injury to kidneys or bladder?: No Painful intercourse?: No Weak stream?: Yes Erection problems?: No Penile pain?: No  Gastrointestinal Nausea?: No Vomiting?: No Indigestion/heartburn?: No Diarrhea?: No Constipation?: No  Constitutional Fever: No Night sweats?: No Weight loss?: Yes Fatigue?: No  Skin Skin rash/lesions?:  No Itching?: Yes  Eyes Blurred vision?: No Double vision?: No  Ears/Nose/Throat Sore throat?: No Sinus problems?: No  Hematologic/Lymphatic Swollen glands?: No Easy bruising?: No  Cardiovascular Leg swelling?: No Chest pain?: No  Respiratory Cough?: No Shortness of breath?: No  Endocrine Excessive thirst?: No  Musculoskeletal Back pain?: Yes Joint pain?: Yes  Neurological Headaches?: No Dizziness?: Yes  Psychologic Depression?: No Anxiety?: Yes  Physical Exam: BP (!) 145/88 (BP Location: Left Arm, Patient Position: Sitting, Cuff Size: Normal)   Pulse 83   Ht 5' 11" (1.803 m)   Wt 148 lb 4.8 oz (67.3 kg)   BMI  20.68 kg/m   Constitutional:  Alert and oriented, No acute distress. HEENT: [place] AT, moist mucus membranes.  Trachea midline, no masses. Cardiovascular: No clubbing, cyanosis, or edema. Respiratory: Normal respiratory effort, no increased work of breathing. GI: Abdomen is soft, nontender, nondistended, no abdominal masses GU: No CVA tenderness.  Rectal: Normal sphincter tone.  Rock hard nodular prostate, 50+ cc involving the entire left of gland.   Skin: No rashes, bruises or suspicious lesions. Neurologic: Grossly intact, no focal deficits, moving all 4 extremities. Psychiatric: Normal mood and affect.  Laboratory Data: 12/01/16 Cr 0.9 PSA 369 AST 42/ ALT 12 Alk phos 414  Pertinent Imaging: CT/ bone scan pending  Assessment & Plan:    1. Prostate cancer [facility]) 71 year old male with markedly elevated PSA highly concerning for advanced metastatic disease. 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  I have recommended prostate biopsy next week, we'll obtain clearance from PCP to hold her aspirin prior to this procedure.We discussed prostate biopsy in detail including the procedure itself, the risks of blood in the urine, stool, and ejaculate, serious infection, and discomfort. He is willing to proceed with this as discussed.  In the meantime, to go ahead and get him started on androgen deprivation therapy.  We reviewed the side effects of induration deprivation therapy and detailed including risk of hot flashes, loss of muscle mass in bone density, cardiovascular effects of long term ADT, etc.    Discussed importance of bone health on ADT, recommend 1000-1200 mg daily calcium suppliment and [phone] IU vit D daily.  Also encouraged weight being exercises and cardiovascular health.  Bone scan and CT abd/ pelvis with contrast ordered.  - degarelix (FIRMAGON) injection 240 mg; Inject 6 mLs (240 mg total)  into the skin once.  2. Weight loss Concerning for advanced metastatic cancer  3. Elevated alkaline phosphatase level Concerning for bone mets   Return in about 1 week (around 12/14/2016) for prostate biopsy, f/u CT scan/ bone scan.  Hollice Espy, MD  [facility] [address] [place], [place] [address] [phone]

## 2016-12-07 NOTE — Progress Notes (Signed)
Firmagon Sub Q Injection  Due to Prostate Cancer patient is present today for a Firmagon Injection.   Medication: Mills Koller (Degarelix)  Dose: 240mg  Location: right and left upper abdomen Lot: [id number] Exp: 01/2019  Patient tolerated well, no complications were noted  Performed by: Elberta Leatherwood, CMA, Toniann Fail, LPN  Follow up: 1 week for Prostate Biopsy

## 2016-12-07 NOTE — Patient Instructions (Signed)
Discussed importance of bone health on ADT, recommend 1000-1200 mg daily calcium suppliment and 800-1000 IU vit D daily.  Also encouragedweight being exercises and cardiovascular health.   Leuprolide depot injection What is this medicine? LEUPROLIDE (loo PROE lide) is a man-made protein that acts like a natural hormone in the body. It decreases testosterone in men and decreases estrogen in women. In men, this medicine is used to treat advanced prostate cancer. In women, some forms of this medicine may be used to treat endometriosis, uterine fibroids, or other male hormone-related problems. This medicine may be used for other purposes; ask your health care provider or pharmacist if you have questions. COMMON BRAND NAME(S): Eligard, Lupron Depot, Lupron Depot-Ped, Viadur What should I tell my health care provider before I take this medicine? They need to know if you have any of these conditions: -diabetes -heart disease or previous heart attack -high blood pressure -high cholesterol -mental illness -osteoporosis -pain or difficulty passing urine -seizures -spinal cord metastasis -stroke -suicidal thoughts, plans, or attempt; a previous suicide attempt by you or a family member -tobacco smoker -unusual vaginal bleeding (women) -an unusual or allergic reaction to leuprolide, benzyl alcohol, other medicines, foods, dyes, or preservatives -pregnant or trying to get pregnant -breast-feeding How should I use this medicine? This medicine is for injection into a muscle or for injection under the skin. It is given by a health care professional in a hospital or clinic setting. The specific product will determine how it will be given to you. Make sure you understand which product you receive and how often you will receive it. Talk to your pediatrician regarding the use of this medicine in children. Special care may be needed. Overdosage: If you think you have taken too much of this medicine contact  a poison control center or emergency room at once. NOTE: This medicine is only for you. Do not share this medicine with others. What if I miss a dose? It is important not to miss a dose. Call your doctor or health care professional if you are unable to keep an appointment. Depot injections: Depot injections are given either once-monthly, every 12 weeks, every 16 weeks, or every 24 weeks depending on the product you are prescribed. The product you are prescribed will be based on if you are male or male, and your condition. Make sure you understand your product and dosing. What may interact with this medicine? Do not take this medicine with any of the following medications: -chasteberry This medicine may also interact with the following medications: -herbal or dietary supplements, like black cohosh or DHEA -male hormones, like estrogens or progestins and birth control pills, patches, rings, or injections -male hormones, like testosterone This list may not describe all possible interactions. Give your health care provider a list of all the medicines, herbs, non-prescription drugs, or dietary supplements you use. Also tell them if you smoke, drink alcohol, or use illegal drugs. Some items may interact with your medicine. What should I watch for while using this medicine? Visit your doctor or health care professional for regular checks on your progress. During the first weeks of treatment, your symptoms may get worse, but then will improve as you continue your treatment. You may get hot flashes, increased bone pain, increased difficulty passing urine, or an aggravation of nerve symptoms. Discuss these effects with your doctor or health care professional, some of them may improve with continued use of this medicine. Male patients may experience a menstrual cycle or spotting during   the first months of therapy with this medicine. If this continues, contact your doctor or health care professional. What  side effects may I notice from receiving this medicine? Side effects that you should report to your doctor or health care professional as soon as possible: -allergic reactions like skin rash, itching or hives, swelling of the face, lips, or tongue -breathing problems -chest pain -depression or memory disorders -pain in your legs or groin -pain at site where injected or implanted -seizures -severe headache -swelling of the feet and legs -suicidal thoughts or other mood changes -visual changes -vomiting Side effects that usually do not require medical attention (report to your doctor or health care professional if they continue or are bothersome): -breast swelling or tenderness -decrease in sex drive or performance -diarrhea -hot flashes -loss of appetite -muscle, joint, or bone pains -nausea -redness or irritation at site where injected or implanted -skin problems or acne This list may not describe all possible side effects. Call your doctor for medical advice about side effects. You may report side effects to FDA at 1-800-FDA-1088. Where should I keep my medicine? This drug is given in a hospital or clinic and will not be stored at home. NOTE: This sheet is a summary. It may not cover all possible information. If you have questions about this medicine, talk to your doctor, pharmacist, or health care provider.  2018 Elsevier/Gold Standard (2016-03-05 09:45:53)  

## 2016-12-08 ENCOUNTER — Ambulatory Visit: Payer: Medicare Other

## 2016-12-08 ENCOUNTER — Encounter: Admission: RE | Admit: 2016-12-08 | Payer: Medicare Other | Source: Ambulatory Visit

## 2016-12-09 ENCOUNTER — Ambulatory Visit: Payer: Self-pay | Admitting: Urology

## 2016-12-11 ENCOUNTER — Encounter
Admission: RE | Admit: 2016-12-11 | Discharge: 2016-12-11 | Disposition: A | Discharge status: Home / Self Care | Payer: Medicare Other | Source: Ambulatory Visit | Attending: Urology | Admitting: Urology

## 2016-12-11 DIAGNOSIS — I700 Atherosclerosis of aorta: Secondary | ICD-10-CM | POA: Diagnosis not present

## 2016-12-11 DIAGNOSIS — N289 Disorder of kidney and ureter, unspecified: Secondary | ICD-10-CM | POA: Insufficient documentation

## 2016-12-11 DIAGNOSIS — C61 Malignant neoplasm of prostate: Secondary | ICD-10-CM | POA: Diagnosis not present

## 2016-12-11 DIAGNOSIS — R972 Elevated prostate specific antigen [PSA]: Secondary | ICD-10-CM | POA: Diagnosis present

## 2016-12-11 DIAGNOSIS — K769 Liver disease, unspecified: Secondary | ICD-10-CM | POA: Insufficient documentation

## 2016-12-11 DIAGNOSIS — C7951 Secondary malignant neoplasm of bone: Secondary | ICD-10-CM | POA: Insufficient documentation

## 2016-12-11 MED ORDER — IOPAMIDOL (ISOVUE-300) INJECTION 61%
100.0000 mL | Freq: Once | INTRAVENOUS | Status: AC | PRN
  Administered 2016-12-11: 100 mL via INTRAVENOUS

## 2016-12-11 MED ORDER — TECHNETIUM TC 99M MEDRONATE IV KIT
25.0000 | PACK | Freq: Once | INTRAVENOUS | Status: AC | PRN
  Administered 2016-12-11: 22.8 via INTRAVENOUS

## 2016-12-16 ENCOUNTER — Other Ambulatory Visit: Payer: Self-pay | Admitting: Urology

## 2016-12-16 ENCOUNTER — Ambulatory Visit (INDEPENDENT_AMBULATORY_CARE_PROVIDER_SITE_OTHER): Payer: Medicare Other | Admitting: Urology

## 2016-12-16 VITALS — BP 160/89 | HR 79 | Ht 70.0 in | Wt 148.0 lb

## 2016-12-16 DIAGNOSIS — C7951 Secondary malignant neoplasm of bone: Secondary | ICD-10-CM

## 2016-12-16 DIAGNOSIS — R972 Elevated prostate specific antigen [PSA]: Secondary | ICD-10-CM

## 2016-12-16 DIAGNOSIS — C61 Malignant neoplasm of prostate: Secondary | ICD-10-CM

## 2016-12-16 MED ORDER — LEVOFLOXACIN 500 MG PO TABS
500.0000 mg | ORAL_TABLET | Freq: Once | ORAL | Status: AC
  Administered 2016-12-16: 500 mg via ORAL

## 2016-12-16 MED ORDER — GENTAMICIN SULFATE 40 MG/ML IJ SOLN
80.0000 mg | Freq: Once | INTRAMUSCULAR | Status: AC
  Administered 2016-12-16: 80 mg via INTRAMUSCULAR

## 2016-12-16 NOTE — Progress Notes (Addendum)
12/16/2016 8:06 PM   Kasandra Knudsen Weston Settle 01-Jun-1946 [id number]  Referring provider: Dion Body, MD [facility] [place], [place] [address]  No chief complaint on file.   HPI: 71 yo M with markedly elevated PSA who presents today for confirmatory prostate biopsy in the setting of clinical metastatic prostate cancer. PSA 12/03/2016 which was 362.9 and found to have a rock hard nodular prostate involving the entire left half of the gland.  CT abdomen and pelvis with contrast as well as bone scan showed extensive bony metastatic disease, near super scan but no visceral metastases and no significant adenopathy.  He does have a family history of prostate cancer including possibly his brother.    He does get up x2-3 at night.  He does feel that he is able to empty his bladder a good stream.  He does have urgency without incontinence over the past few months.  No history of UTIs or stones.  No gross hematuria.  He has lost 20 lbs over the past few months.  He does also lower back pain which is chronic.    He does have baseline ED and has used Viagra in the past.     PMH: Past Medical History:  Diagnosis Date  . Arthritis   . Cancer ([place])   . Hepatitis C   . Hyperlipidemia   . Hypertension   . Stroke [facility])     Surgical History: No past surgical history on file.  Home Medications:  Allergies as of 12/16/2016   No Known Allergies     Medication List       Accurate as of 12/16/16  8:06 PM. Always use your most recent med list.          aspirin EC 81 MG tablet Take 81 mg by mouth.   atorvastatin 40 MG tablet Commonly known as:  LIPITOR Take 40 mg by mouth.   cyclobenzaprine 10 MG tablet Commonly known as:  FLEXERIL TAKE ONE TABLET BY MOUTH AT NIGHT AS NEEDED FOR MUSCLE SPASMS   gabapentin 300 MG capsule Commonly known as:  NEURONTIN Take by mouth.   HYDROcodone-acetaminophen 7.5-325 MG tablet Commonly known as:  NORCO Take by  mouth.   lisinopril-hydrochlorothiazide 20-12.5 MG tablet Commonly known as:  PRINZIDE,ZESTORETIC Take by mouth.   mirtazapine 15 MG tablet Commonly known as:  REMERON Take 7.5 mg by mouth.       Allergies: No Known Allergies  Family History: Family History  Problem Relation Age of Onset  . Prostate cancer Neg Hx   . Bladder Cancer Neg Hx   . Kidney cancer Neg Hx     Social History:  reports that he has quit smoking. He has never used smokeless tobacco. His alcohol and drug histories are not on file.   Physical Exam: BP (!) 160/89   Pulse 79   Ht 5' 10"  (1.778 m)   Wt 148 lb (67.1 kg)   BMI 21.24 kg/m   Constitutional:  Alert and oriented, No acute distress. HEENT: [place] AT, moist mucus membranes.  Trachea midline, no masses. Cardiovascular: No clubbing, cyanosis, or edema. Respiratory: Normal respiratory effort, no increased work of breathing. GI: Abdomen is soft, nontender, nondistended, no abdominal masses GU: No CVA tenderness.  Rectal: Normal sphincter tone.  Rock hard nodular prostate, 50+ cc involving the entire left of gland.   Skin: No rashes, bruises or suspicious lesions. Neurologic: Grossly intact, no focal deficits, moving all 4 extremities. Psychiatric: Normal mood and  affect.  Laboratory Data: 12/01/16 Cr 0.9 PSA 369 AST 42/ ALT 12 Alk phos 414  Pertinent Imaging: CLINICAL DATA:  Elevated PSA.  Metastatic prostate cancer.  EXAM: NUCLEAR MEDICINE WHOLE BODY BONE SCAN  TECHNIQUE: Whole body anterior and posterior images were obtained approximately 3 hours after intravenous injection of radiopharmaceutical.  RADIOPHARMACEUTICALS:  22.8 mCi Technetium-74mMDP IV  COMPARISON:  CT scan December 11, 2016  FINDINGS: Widespread bony metastatic disease involving the bilateral scapula, cervical/ thoracic/ lumbar spine, pelvic bones, proximal femurs, ribs, and likely the calvarium.  IMPRESSION: Widespread bony metastatic disease.  Near super  scan appearance.   Electronically Signed   By: DDorise BullionIII M.D   On: 12/11/2016 15:49    CLINICAL DATA:  71year old male with history prostate cancer. Followup study.  EXAM: CT ABDOMEN AND PELVIS WITH CONTRAST  TECHNIQUE: Multidetector CT imaging of the abdomen and pelvis was performed using the standard protocol following bolus administration of intravenous contrast.  CONTRAST:  1037mISOVUE-300 IOPAMIDOL (ISOVUE-300) INJECTION 61%  COMPARISON:  No priors.  FINDINGS: Lower chest: Unremarkable.  Hepatobiliary: Multiple subcentimeter low-attenuation lesions scattered throughout the liver, too small to characterize, but favored to represent tiny cysts. No larger more suspicious appearing hepatic lesions are noted. No intra or extrahepatic biliary ductal dilatation. 11 mm calcified gallstone in the neck of the gallbladder. No findings to suggest an acute cholecystitis at this time.  Pancreas: No pancreatic mass. No pancreatic ductal dilatation. No pancreatic or peripancreatic fluid or inflammatory changes.  Spleen: Unremarkable.  Adrenals/Urinary Tract: Subcentimeter low-attenuation lesions in the lower pole of the left kidney are too small to characterize. Right kidney and bilateral adrenal glands are normal in appearance. No hydroureteronephrosis. Urinary bladder is unremarkable in appearance.  Stomach/Bowel: Normal appearance of the stomach. No pathologic dilatation of small bowel or colon. Normal appendix.  Vascular/Lymphatic: Aortic atherosclerosis, without evidence of aneurysm or dissection in the abdominal or pelvic vasculature. No lymphadenopathy noted in the abdomen or pelvis.  Reproductive: Prostate gland and seminal vesicles are grossly unremarkable in appearance.  Other: No significant volume of ascites.  No pneumoperitoneum.  Musculoskeletal: Innumerable sclerotic lesions are noted throughout all aspects of the visualized  axial and appendicular skeleton, compatible with widespread metastatic disease to the bones.  IMPRESSION: 1. Diffuse sclerotic osseous metastases throughout all aspects of the visualized axial and appendicular skeleton. 2. No definitive extra skeletal metastatic disease noted on today's examination. 3. There are several tiny subcentimeter low-attenuation lesions in the liver and left kidney, too small to characterize, but favored to represent cysts. Attention at time of followup studies is recommended to ensure the stability of these findings. 4. Aortic atherosclerosis.   Electronically Signed   By: DaVinnie Langton.D.   On: 12/11/2016 12:44  Prostate Biopsy Procedure   Informed consent was obtained after discussing risks/benefits of the procedure.  A time out was performed to ensure correct patient identity.  Pre-Procedure: - Last PSA Level: No results found for: PSA - Gentamicin given prophylactically - Levaquin 500 mg administered PO -Transrectal Ultrasound performed revealing a 32 gm prostate -Multiple diffuse hypoechoic lesions diffusely throughout the prostate. There is also an ill-defined border at the bladder neck concerning for possible local invasion at this level.  Procedure: - Prostate block performed using 10 cc 1% lidocaine and biopsies taken from sextant areas, a total of 6 under ultrasound guidance.  Post-Procedure: - Patient tolerated the procedure well - He was counseled to seek immediate medical attention if experiences any severe pain,  significant bleeding, or fevers - Return in one week to discuss biopsy results   Assessment & Plan:    1. Prostate cancer [facility]) 71 year old male with clinical cT2b N0 M1 prostate cancer started on degarelix last week s/p biopsy today.  Extensive bony metastatic disease identified today, symptomatic.  Findings a CT and bone scan were reviewed today extensively with he and his wife.  Additional literature on  prostate cancer was given today to the patient and all questions answered.  I have recommended an additional referral to medical oncology for consideration of possible addition of abiraterone with prednisone (Latitude trial) vs docitaxel given high risk diease.  In addition, he may be a candidate for bone strengthening medication. Orders Placed This Encounter  Procedures  . Ambulatory referral to Oncology    Return in about 3 weeks (around 01/06/2017) for degerelix, PSA.  Hollice Espy, MD  [facility] [address], [place], [place] [address] [phone]

## 2016-12-16 NOTE — Addendum Note (Signed)
Addended by: Hollice Espy on: 12/16/2016 08:23 PM   Modules accepted: Level of Service

## 2016-12-24 ENCOUNTER — Ambulatory Visit: Payer: Medicare Other | Admitting: Urology

## 2016-12-24 LAB — PATHOLOGY REPORT

## 2016-12-25 ENCOUNTER — Other Ambulatory Visit: Payer: Self-pay | Admitting: Urology

## 2017-01-07 ENCOUNTER — Encounter: Payer: Self-pay | Admitting: Oncology

## 2017-01-07 ENCOUNTER — Inpatient Hospital Stay: Payer: Medicare Other

## 2017-01-07 ENCOUNTER — Inpatient Hospital Stay: Payer: Medicare Other | Attending: Oncology | Admitting: Oncology

## 2017-01-07 VITALS — BP 149/86 | HR 76 | Temp 95.8°F | Resp 18 | Ht 68.9 in | Wt 159.7 lb

## 2017-01-07 DIAGNOSIS — R634 Abnormal weight loss: Secondary | ICD-10-CM | POA: Diagnosis not present

## 2017-01-07 DIAGNOSIS — I10 Essential (primary) hypertension: Secondary | ICD-10-CM

## 2017-01-07 DIAGNOSIS — C61 Malignant neoplasm of prostate: Secondary | ICD-10-CM

## 2017-01-07 DIAGNOSIS — Z5111 Encounter for antineoplastic chemotherapy: Secondary | ICD-10-CM | POA: Diagnosis present

## 2017-01-07 DIAGNOSIS — G8929 Other chronic pain: Secondary | ICD-10-CM | POA: Diagnosis not present

## 2017-01-07 DIAGNOSIS — B370 Candidal stomatitis: Secondary | ICD-10-CM | POA: Diagnosis not present

## 2017-01-07 DIAGNOSIS — Z8673 Personal history of transient ischemic attack (TIA), and cerebral infarction without residual deficits: Secondary | ICD-10-CM | POA: Insufficient documentation

## 2017-01-07 DIAGNOSIS — Z79899 Other long term (current) drug therapy: Secondary | ICD-10-CM | POA: Insufficient documentation

## 2017-01-07 DIAGNOSIS — E785 Hyperlipidemia, unspecified: Secondary | ICD-10-CM | POA: Insufficient documentation

## 2017-01-07 DIAGNOSIS — C7951 Secondary malignant neoplasm of bone: Secondary | ICD-10-CM | POA: Diagnosis not present

## 2017-01-07 DIAGNOSIS — M545 Low back pain: Secondary | ICD-10-CM | POA: Diagnosis not present

## 2017-01-07 DIAGNOSIS — M5116 Intervertebral disc disorders with radiculopathy, lumbar region: Secondary | ICD-10-CM | POA: Insufficient documentation

## 2017-01-07 DIAGNOSIS — I69354 Hemiplegia and hemiparesis following cerebral infarction affecting left non-dominant side: Secondary | ICD-10-CM | POA: Insufficient documentation

## 2017-01-07 DIAGNOSIS — Z7982 Long term (current) use of aspirin: Secondary | ICD-10-CM

## 2017-01-07 DIAGNOSIS — Z7689 Persons encountering health services in other specified circumstances: Secondary | ICD-10-CM | POA: Insufficient documentation

## 2017-01-07 DIAGNOSIS — B1920 Unspecified viral hepatitis C without hepatic coma: Secondary | ICD-10-CM | POA: Diagnosis not present

## 2017-01-07 DIAGNOSIS — Z87891 Personal history of nicotine dependence: Secondary | ICD-10-CM | POA: Insufficient documentation

## 2017-01-07 DIAGNOSIS — Z7189 Other specified counseling: Secondary | ICD-10-CM

## 2017-01-07 LAB — COMPREHENSIVE METABOLIC PANEL
ALBUMIN: 3.6 g/dL (ref 3.5–5.0)
ALK PHOS: 545 U/L — AB (ref 38–126)
ALT: 20 U/L (ref 17–63)
ANION GAP: 3 — AB (ref 5–15)
AST: 29 U/L (ref 15–41)
BUN: 15 mg/dL (ref 6–20)
CALCIUM: 9.1 mg/dL (ref 8.9–10.3)
CO2: 29 mmol/L (ref 22–32)
Chloride: 107 mmol/L (ref 101–111)
Creatinine, Ser: 0.75 mg/dL (ref 0.61–1.24)
GFR calc non Af Amer: >60 mL/min (ref >60)
Glucose, Bld: 92 mg/dL (ref 65–99)
POTASSIUM: 4.4 mmol/L (ref 3.5–5.1)
SODIUM: 139 mmol/L (ref 135–145)
TOTAL PROTEIN: 7.1 g/dL (ref 6.5–8.1)
Total Bilirubin: 0.6 mg/dL (ref 0.3–1.2)

## 2017-01-07 LAB — CBC WITH DIFFERENTIAL/PLATELET
BASOS ABS: 0 10*3/uL (ref 0–0.1)
Basophils Relative: 1 %
Eosinophils Absolute: 0.1 10*3/uL (ref 0–0.7)
Eosinophils Relative: 3 %
HEMATOCRIT: 33.4 % — AB (ref 40.0–52.0)
HEMOGLOBIN: 11.2 g/dL — AB (ref 13.0–18.0)
LYMPHS PCT: 26 %
Lymphs Abs: 1.1 10*3/uL (ref 1.0–3.6)
MCH: 29.4 pg (ref 26.0–34.0)
MCHC: 33.5 g/dL (ref 32.0–36.0)
MCV: 87.9 fL (ref 80.0–100.0)
MONOS PCT: 7 %
Monocytes Absolute: 0.3 10*3/uL (ref 0.2–1.0)
NEUTROS ABS: 2.7 10*3/uL (ref 1.4–6.5)
NEUTROS PCT: 63 %
Platelets: 235 10*3/uL (ref 150–440)
RBC: 3.8 MIL/uL — ABNORMAL LOW (ref 4.40–5.90)
RDW: 18.8 % — ABNORMAL HIGH (ref 11.5–14.5)
WBC: 4.2 10*3/uL (ref 3.8–10.6)

## 2017-01-07 NOTE — Progress Notes (Signed)
Hematology/Oncology Consult note [facility] [phone] [phone]  Patient Care Team: Dion Body, MD as PCP - General (Family Medicine)   Name of the patient: Francisco Lowe  [id number]  08/23/1946    Reason for referral- metastatic prostate cancer with bone mets   Referring physician- Dr. Erlene Quan  Date of visit: 01/07/17   History of presenting illness- patient is a 71 year old male who was referred to Dr. Hollice Espy for elevated PSA. PSA on 12/03/2016 was 362.9 and patient was also found to have enlarged hard nodular prostate involving the left half of the gland.patient received his first dose of Firmagon injection on 12/07/2016. He also underwent prostate biopsy which showedprosthetic adenocarcinoma in 4 out of 6 cores submitted. Gleason score of 7.CT abdomen pelvis from 12/11/2016 showed diffuse sclerotic osseous metastases throughout the actual and appendicular skeleton. Subcentimeter low attenuation lesions in the kidney and liver too small to characterized but favored to represent cysts.bone scan showed widespread bony metastatic disease involving bilateral scapula, cervical/thoracic/lumbar spine, pelvic bones, proximal femurs and ribs and likely the calvarium.Nearly super scan appearance.patient reports that he has lost about 20 pounds of weightover the last few months. He lives with his wife and extended family at home and is independent of his ADLs and IADLs.he reports chronic low back pain but denies other complaints  ECOG PS- 1  Pain scale- 6- back pain   Review of systems- Review of Systems  Constitutional: Negative for chills, fever, malaise/fatigue and weight loss.  HENT: Negative for congestion, ear discharge and nosebleeds.   Eyes: Negative for blurred vision.  Respiratory: Negative for cough, hemoptysis, sputum production, shortness of breath and wheezing.   Cardiovascular: Negative for chest pain, palpitations,  orthopnea and claudication.  Gastrointestinal: Negative for abdominal pain, blood in stool, constipation, diarrhea, heartburn, melena, nausea and vomiting.  Genitourinary: Negative for dysuria, flank pain, frequency, hematuria and urgency.  Musculoskeletal: Positive for back pain. Negative for joint pain and myalgias.  Skin: Negative for rash.  Neurological: Negative for dizziness, tingling, focal weakness, seizures, weakness and headaches.  Endo/Heme/Allergies: Does not bruise/bleed easily.  Psychiatric/Behavioral: Negative for depression and suicidal ideas. The patient does not have insomnia.     No Known Allergies  Patient Active Problem List   Diagnosis Date Noted  . Chronic low back pain 12/07/2016  . Erectile dysfunction 12/07/2016  . Essential hypertension 12/07/2016  . Hepatitis C 12/07/2016  . Medicare annual wellness visit, initial 06/19/2016  . Status post CVA 02/17/2016  . HTN (hypertension) 12/20/2015  . Stroke ([place]) 12/20/2015  . DDD (degenerative disc disease), lumbar 06/19/2014  . Left lumbar radiculitis 06/18/2014  . Diverticulosis 06/11/2004     Past Medical History:  Diagnosis Date  . Arthritis   . Cancer ([place])   . Hepatitis C   . Hyperlipidemia   . Hypertension   . Stroke [facility])    w left sided weakness per pt (2017)     History reviewed. No pertinent surgical history.  Social History   Social History  . Marital status: Married    Spouse name: N/A  . Number of children: N/A  . Years of education: N/A   Occupational History  . Not on file.   Social History Main Topics  . Smoking status: Former Smoker    Packs/day: 0.25    Years: 20.00    Types: Cigarettes    Quit date: 01/07/1997  . Smokeless tobacco: Never Used  . Alcohol use No  . Drug use: No  .  Sexual activity: No   Other Topics Concern  . Not on file   Social History Narrative  . No narrative on file     Family History  Problem Relation Age of Onset  . Cancer Sister   .  Prostatitis Brother   . Prostatitis Brother   . Prostate cancer Neg Hx   . Bladder Cancer Neg Hx   . Kidney cancer Neg Hx      Current Outpatient Prescriptions:  .  aspirin EC 81 MG tablet, Take 81 mg by mouth., Disp: , Rfl:  .  atorvastatin (LIPITOR) 40 MG tablet, Take 40 mg by mouth., Disp: , Rfl:  .  cyclobenzaprine (FLEXERIL) 10 MG tablet, TAKE ONE TABLET BY MOUTH AT NIGHT AS NEEDED FOR MUSCLE SPASMS, Disp: , Rfl:  .  gabapentin (NEURONTIN) 300 MG capsule, Take by mouth 2 (two) times daily. , Disp: , Rfl:  .  HYDROcodone-acetaminophen (NORCO) 7.5-325 MG tablet, Take by mouth., Disp: , Rfl:  .  lisinopril (PRINIVIL,ZESTRIL) 5 MG tablet, , Disp: , Rfl:  .  mirtazapine (REMERON) 15 MG tablet, Take 7.5 mg by mouth., Disp: , Rfl:    Physical exam:  Vitals:   01/07/17 1538  BP: (!) 149/86  Pulse: 76  Resp: 18  Temp: (!) 95.8 F (35.4 C)  TempSrc: Tympanic  Weight: 159 lb 11.2 oz (72.4 kg)  Height: 5' 8.9" (1.75 m)   Physical Exam  Constitutional: He is oriented to person, place, and time and well-developed, well-nourished, and in no distress.  HENT:  Head: Normocephalic and atraumatic.  edentulous  Eyes: EOM are normal. Pupils are equal, round, and reactive to light.  Neck: Normal range of motion.  Cardiovascular: Normal rate, regular rhythm and normal heart sounds.   Pulmonary/Chest: Effort normal and breath sounds normal.  Abdominal: Soft. Bowel sounds are normal.  Neurological: He is alert and oriented to person, place, and time.  Skin: Skin is warm and dry.       CMP Latest Ref Rng & Units 01/07/2017  Glucose 65 - 99 mg/dL 92  BUN 6 - 20 mg/dL 15  Creatinine 0.61 - 1.24 mg/dL 0.75  Sodium 135 - 145 mmol/L 139  Potassium 3.5 - 5.1 mmol/L 4.4  Chloride 101 - 111 mmol/L 107  CO2 22 - 32 mmol/L 29  Calcium 8.9 - 10.3 mg/dL 9.1  Total Protein 6.5 - 8.1 g/dL 7.1  Total Bilirubin 0.3 - 1.2 mg/dL 0.6  Alkaline Phos 38 - 126 U/L 545(H)  AST 15 - 41 U/L 29  ALT 17 -  63 U/L 20   CBC Latest Ref Rng & Units 01/07/2017  WBC 3.8 - 10.6 K/uL 4.2  Hemoglobin 13.0 - 18.0 g/dL 11.2(L)  Hematocrit 40.0 - 52.0 % 33.4(L)  Platelets 150 - 440 K/uL 235    No images are attached to the encounter.  Nm Bone Scan Whole Body  Result Date: 12/11/2016 CLINICAL DATA:  Elevated PSA.  Metastatic prostate cancer. EXAM: NUCLEAR MEDICINE WHOLE BODY BONE SCAN TECHNIQUE: Whole body anterior and posterior images were obtained approximately 3 hours after intravenous injection of radiopharmaceutical. RADIOPHARMACEUTICALS:  22.8 mCi Technetium-8m MDP IV COMPARISON:  CT scan December 11, 2016 FINDINGS: Widespread bony metastatic disease involving the bilateral scapula, cervical/ thoracic/ lumbar spine, pelvic bones, proximal femurs, ribs, and likely the calvarium. IMPRESSION: Widespread bony metastatic disease.  Near super scan appearance. Electronically Signed   By: Dorise Bullion III M.D   On: 12/11/2016 15:49   Ct Abdomen Pelvis W  Contrast  Result Date: 12/11/2016 CLINICAL DATA:  71 year old male with history prostate cancer. Followup study. EXAM: CT ABDOMEN AND PELVIS WITH CONTRAST TECHNIQUE: Multidetector CT imaging of the abdomen and pelvis was performed using the standard protocol following bolus administration of intravenous contrast. CONTRAST:  115mL ISOVUE-300 IOPAMIDOL (ISOVUE-300) INJECTION 61% COMPARISON:  No priors. FINDINGS: Lower chest: Unremarkable. Hepatobiliary: Multiple subcentimeter low-attenuation lesions scattered throughout the liver, too small to characterize, but favored to represent tiny cysts. No larger more suspicious appearing hepatic lesions are noted. No intra or extrahepatic biliary ductal dilatation. 11 mm calcified gallstone in the neck of the gallbladder. No findings to suggest an acute cholecystitis at this time. Pancreas: No pancreatic mass. No pancreatic ductal dilatation. No pancreatic or peripancreatic fluid or inflammatory changes. Spleen: Unremarkable.  Adrenals/Urinary Tract: Subcentimeter low-attenuation lesions in the lower pole of the left kidney are too small to characterize. Right kidney and bilateral adrenal glands are normal in appearance. No hydroureteronephrosis. Urinary bladder is unremarkable in appearance. Stomach/Bowel: Normal appearance of the stomach. No pathologic dilatation of small bowel or colon. Normal appendix. Vascular/Lymphatic: Aortic atherosclerosis, without evidence of aneurysm or dissection in the abdominal or pelvic vasculature. No lymphadenopathy noted in the abdomen or pelvis. Reproductive: Prostate gland and seminal vesicles are grossly unremarkable in appearance. Other: No significant volume of ascites.  No pneumoperitoneum. Musculoskeletal: Innumerable sclerotic lesions are noted throughout all aspects of the visualized axial and appendicular skeleton, compatible with widespread metastatic disease to the bones. IMPRESSION: 1. Diffuse sclerotic osseous metastases throughout all aspects of the visualized axial and appendicular skeleton. 2. No definitive extra skeletal metastatic disease noted on today's examination. 3. There are several tiny subcentimeter low-attenuation lesions in the liver and left kidney, too small to characterize, but favored to represent cysts. Attention at time of followup studies is recommended to ensure the stability of these findings. 4. Aortic atherosclerosis. Electronically Signed   By: Vinnie Langton M.D.   On: 12/11/2016 12:44    Assessment and plan- Patient is a 71 y.o. male with newly diagnosed castration sensitive metastatic prostate cancer with extensive bony metastases  Given that patient had an elevated PSA of 362.9 at diagnosis along with extensive bony metastases he would certainly benefit from additional treatment in addition to ADT that he has been receiving. I did discuss the results of both the STAMPEDE trial as well as the LATITUDE trial which showed improvement in overall survival as  compared to ADT alone. Strictly speaking patient does not meet all the criteria for latitude trial given that his Gleason score is 7 and he has no evidence of visceral metastases. I discussed the risks and benefits of docetaxel which would be given at 75 mg/m IV every 3 weeks 6 cycles versus laparotomy which would be a pill to be taken once a day along with prednisone until progression. I discussed side effects of chemotherapy including all but not limited to nausea, vomiting, low blood counts, risk of infections and peripheral neuropathy. Patient understands both his options and would like to proceed with chemotherapy instead of abiraterone. We will plan for port placement for chemotherapy. I will also obtain baseline CT chest to complete his staging workup.Patient will attendchemotherapy class and tentatively start chemotherapy in 2 weeks. Patient understands that the goal of treatment is palliative and not curative to improve his quality of life and longevity.  With regards to use of bisphosphonate, STAMPEDE  trial did not show any evidence of benefit in castration sensitive disease and hence I will hold  off on that. Given that patient is on ADT I will obtain a baseline DEXA scan and if he has significant osteopenia or osteoporosis he could certainly consider bisphosphonate every 6 months from that standpoint    Total face to face encounter time for this patient visit was 45 min. >50% of the time was  spent in counseling and coordination of care.     Thank you for this kind referral and the opportunity to participate in the care of this patient   Visit Diagnosis 1. Prostate cancer [facility])     Dr. Randa Evens, MD, MPH [facility] Pager- [phone] 01/07/2017  1:09 PM

## 2017-01-07 NOTE — Progress Notes (Signed)
Here for new pt evaluation  Per wife asked this writer to include that pt has periods of confusion esp over past 6 mo. (of note pt told he had a stroke March 2017 w residual left side weakness. Stated that they were recently told he may have had 2 strokes all together  Vella Raring RN

## 2017-01-08 ENCOUNTER — Telehealth (INDEPENDENT_AMBULATORY_CARE_PROVIDER_SITE_OTHER): Payer: Self-pay

## 2017-01-08 ENCOUNTER — Other Ambulatory Visit (INDEPENDENT_AMBULATORY_CARE_PROVIDER_SITE_OTHER): Payer: Self-pay | Admitting: Vascular Surgery

## 2017-01-08 DIAGNOSIS — C61 Malignant neoplasm of prostate: Secondary | ICD-10-CM | POA: Insufficient documentation

## 2017-01-08 DIAGNOSIS — C7951 Secondary malignant neoplasm of bone: Secondary | ICD-10-CM

## 2017-01-08 DIAGNOSIS — Z7189 Other specified counseling: Secondary | ICD-10-CM | POA: Insufficient documentation

## 2017-01-08 LAB — PSA: PSA: 25.02 ng/mL — AB (ref 0.00–4.00)

## 2017-01-08 MED ORDER — LORAZEPAM 0.5 MG PO TABS: 0.5000 mg | ORAL_TABLET | Freq: Four times a day (QID) | ORAL | 0 refills | Status: DC | PRN

## 2017-01-08 MED ORDER — DEXAMETHASONE 4 MG PO TABS: 8.0000 mg | ORAL_TABLET | Freq: Two times a day (BID) | ORAL | 1 refills | Status: DC

## 2017-01-08 MED ORDER — PROCHLORPERAZINE MALEATE 10 MG PO TABS: 10.0000 mg | ORAL_TABLET | Freq: Four times a day (QID) | ORAL | 1 refills | Status: DC | PRN

## 2017-01-08 MED ORDER — ONDANSETRON HCL 8 MG PO TABS: 8.0000 mg | ORAL_TABLET | Freq: Two times a day (BID) | ORAL | 1 refills | Status: DC | PRN

## 2017-01-08 MED ORDER — LIDOCAINE-PRILOCAINE 2.5-2.5 % EX CREA: TOPICAL_CREAM | CUTANEOUS | 3 refills | Status: DC

## 2017-01-08 NOTE — Progress Notes (Signed)
START ON PATHWAY REGIMEN - Prostate   Docetaxel 75 mg/m2:   A cycle is every 21 days:     Docetaxel   **Always confirm dose/schedule in your pharmacy ordering system[facility] Ag[facility] + Bicalutamide:   A cycle is every 12 weeks:     Leuprolide acetate depot    Daily:     Bicalutamide   **Always confirm dose/schedule in your pharmacy ordering system**    Patient Characteristics: Adenocarcinoma, Metastatic, Hormone Naive, High Volume Disease* Current radiographic evidence of distant metastasis? Yes Histology: Adenocarcinoma AJCC T Category: pT2 Gleason Primary: X AJCC N Category: N0 Gleason Secondary: X AJCC M Category: M1b Gleason Score: 7 AJCC 8 Stage Grouping: IVB PSA Values (ng/mL): >= 20 Would you be surprised if this patient died  in the next year? I would be surprised if this patient died in the next year  Intent of Therapy: Non-Curative / Palliative Intent, Discussed with Patient

## 2017-01-08 NOTE — Telephone Encounter (Signed)
Attempted to contact patient regarding having his port placed, I left a message for him to return my call.

## 2017-01-08 NOTE — Telephone Encounter (Signed)
Patient called back and has been scheduled for his port placement.

## 2017-01-12 ENCOUNTER — Ambulatory Visit
Admission: RE | Admit: 2017-01-12 | Discharge: 2017-01-12 | Disposition: A | Discharge status: Home / Self Care | Payer: Medicare Other | Source: Ambulatory Visit | Attending: Vascular Surgery | Admitting: Vascular Surgery

## 2017-01-12 ENCOUNTER — Encounter
Admission: RE | Disposition: A | Discharge status: Home / Self Care | Payer: Self-pay | Source: Ambulatory Visit | Attending: Vascular Surgery

## 2017-01-12 DIAGNOSIS — C61 Malignant neoplasm of prostate: Secondary | ICD-10-CM | POA: Insufficient documentation

## 2017-01-12 HISTORY — PX: PORTA CATH INSERTION: CATH118285

## 2017-01-12 SURGERY — PORTA CATH INSERTION
Anesthesia: Moderate Sedation

## 2017-01-12 MED ORDER — FENTANYL CITRATE (PF) 100 MCG/2ML IJ SOLN
INTRAMUSCULAR | Status: AC
  Filled 2017-01-12: qty 2

## 2017-01-12 MED ORDER — ONDANSETRON HCL 4 MG/2ML IJ SOLN: 4.0000 mg | Freq: Four times a day (QID) | INTRAMUSCULAR | Status: DC | PRN

## 2017-01-12 MED ORDER — MIDAZOLAM HCL 2 MG/2ML IJ SOLN
INTRAMUSCULAR | Status: AC
  Filled 2017-01-12: qty 2

## 2017-01-12 MED ORDER — CEFAZOLIN IN D5W 1 GM/50ML IV SOLN: 1.0000 g | Freq: Once | INTRAVENOUS | Status: DC

## 2017-01-12 MED ORDER — SODIUM CHLORIDE 0.9 % IR SOLN
Freq: Once | Status: DC
  Filled 2017-01-12: qty 2

## 2017-01-12 MED ORDER — FENTANYL CITRATE (PF) 100 MCG/2ML IJ SOLN
INTRAMUSCULAR | Status: DC | PRN
  Administered 2017-01-12: 50 ug via INTRAVENOUS
  Administered 2017-01-12: 100 ug via INTRAVENOUS

## 2017-01-12 MED ORDER — CEFAZOLIN SODIUM-DEXTROSE 2-4 GM/100ML-% IV SOLN
2.0000 g | Freq: Once | INTRAVENOUS | Status: DC
  Administered 2017-01-12: 1 g via INTRAVENOUS
  Filled 2017-01-12: qty 100

## 2017-01-12 MED ORDER — MIDAZOLAM HCL 2 MG/2ML IJ SOLN
INTRAMUSCULAR | Status: DC | PRN
  Administered 2017-01-12: 2 mg via INTRAVENOUS
  Administered 2017-01-12: 1 mg via INTRAVENOUS

## 2017-01-12 MED ORDER — SODIUM CHLORIDE 0.9 % IV SOLN
INTRAVENOUS | Status: DC
  Administered 2017-01-12: 09:00:00 via INTRAVENOUS

## 2017-01-12 MED ORDER — HEPARIN (PORCINE) IN NACL 2-0.9 UNIT/ML-% IJ SOLN
INTRAMUSCULAR | Status: AC
  Filled 2017-01-12: qty 500

## 2017-01-12 MED ORDER — LIDOCAINE-EPINEPHRINE (PF) 2 %-1:200000 IJ SOLN
INTRAMUSCULAR | Status: AC
  Filled 2017-01-12: qty 20

## 2017-01-12 MED ORDER — HYDROMORPHONE HCL 1 MG/ML IJ SOLN: 1.0000 mg | Freq: Once | INTRAMUSCULAR | Status: DC | PRN

## 2017-01-12 MED ORDER — MIDAZOLAM HCL 5 MG/5ML IJ SOLN
INTRAMUSCULAR | Status: AC
  Filled 2017-01-12: qty 5

## 2017-01-12 SURGICAL SUPPLY — 8 items
DRAPE INCISE IOBAN 66X45 STRL (DRAPES) ×3 IMPLANT
KIT PORT POWER 8FR ISP CVUE (Catheter) ×3 IMPLANT
NEEDLE ENTRY 21GA 7CM ECHOTIP (NEEDLE) ×3 IMPLANT
PACK ANGIOGRAPHY (CUSTOM PROCEDURE TRAY) ×3 IMPLANT
SET INTRO CAPELLA COAXIAL (SET/KITS/TRAYS/PACK) ×3 IMPLANT
SUT MNCRL AB 4-0 PS2 18 (SUTURE) ×3 IMPLANT
SUT PROLENE 0 CT 1 30 (SUTURE) ×3 IMPLANT
SUTURE VIC 3-0 (SUTURE) ×3 IMPLANT

## 2017-01-12 NOTE — H&P (Signed)
[facility] History & Physical Update  The patient was interviewed and re-examined.  The patient's previous History and Physical has been reviewed and is unchanged.  There is no change in the plan of care. We plan to proceed with the scheduled procedure.  Hortencia Pilar, MD  01/12/2017, 9:16 AM

## 2017-01-13 ENCOUNTER — Ambulatory Visit: Admission: RE | Admit: 2017-01-13 | Payer: Medicare Other | Source: Ambulatory Visit

## 2017-01-13 ENCOUNTER — Ambulatory Visit (INDEPENDENT_AMBULATORY_CARE_PROVIDER_SITE_OTHER): Payer: Medicare Other

## 2017-01-13 ENCOUNTER — Ambulatory Visit
Admission: RE | Admit: 2017-01-13 | Discharge: 2017-01-13 | Disposition: A | Discharge status: Home / Self Care | Payer: Medicare Other | Source: Ambulatory Visit | Attending: Oncology | Admitting: Oncology

## 2017-01-13 VITALS — BP 149/89 | HR 77 | Ht 69.0 in | Wt 159.0 lb

## 2017-01-13 DIAGNOSIS — C7951 Secondary malignant neoplasm of bone: Secondary | ICD-10-CM | POA: Insufficient documentation

## 2017-01-13 DIAGNOSIS — C61 Malignant neoplasm of prostate: Secondary | ICD-10-CM | POA: Insufficient documentation

## 2017-01-13 DIAGNOSIS — I700 Atherosclerosis of aorta: Secondary | ICD-10-CM | POA: Insufficient documentation

## 2017-01-13 DIAGNOSIS — I2510 Atherosclerotic heart disease of native coronary artery without angina pectoris: Secondary | ICD-10-CM | POA: Insufficient documentation

## 2017-01-13 HISTORY — DX: Malignant neoplasm of prostate: C61

## 2017-01-13 MED ORDER — DEGARELIX ACETATE 80 MG ~~LOC~~ SOLR
80.0000 mg | Freq: Once | SUBCUTANEOUS | Status: AC
  Administered 2017-01-13: 80 mg via SUBCUTANEOUS

## 2017-01-13 MED ORDER — IOPAMIDOL (ISOVUE-300) INJECTION 61%
100.0000 mL | Freq: Once | INTRAVENOUS | Status: AC | PRN
  Administered 2017-01-13: 75 mL via INTRAVENOUS

## 2017-01-13 NOTE — Progress Notes (Signed)
SubQ Injection  Patient is present today for a SubQ Injection for treatment of Prostate Cancer Drug: Mills Koller (degarelix) Dose: 80mg  Location: left upper quad Lot: [id number] Exp: 01/2017  Patient tolerated well, no complications were noted  Preformed by: Fonnie Jarvis, CMA  Follow up/ Additional Notes: 1 month for a 3 month Lupron

## 2017-01-13 NOTE — Op Note (Signed)
OPERATIVE NOTE   PROCEDURE: 1. Placement of a Right IJ Infuse-a-Port  PRE-OPERATIVE DIAGNOSIS: Metastatic Prostate CA  POST-OPERATIVE DIAGNOSIS: Same  SURGEON: Katha Cabal M.D.  ANESTHESIA: Conscious sedation was administered under my direct supervision by the interventional radiology RN. IV Versed plus fentanyl were utilized. Continuous ECG, pulse oximetry and blood pressure was monitored throughout the entire procedure. Conscious sedation was for a total of 33 minutes.  ESTIMATED BLOOD LOSS: Minimal   FINDING(S): 1.  Patent vein  SPECIMEN(S): None  INDICATIONS:   Francisco Lowe is a 71 y.o. male who presents with metastatic prostate CA and will require chemotherapy.  Therefore, he requires appropriate IV access and a infusiport is being placed.  DESCRIPTION: After obtaining full informed written consent, the patient was brought back to the special procedure suite and placed in the supine position. The patient's right neck and chest wall are prepped and draped in sterile fashion. Appropriate timeout was called.  Ultrasound is placed in a sterile sleeve, ultrasound is utilized to avoid vascular injury as well as secondary to lack of appropriate landmarks. The right IJ vein is identified. It is echolucent and homogeneous as well as easily compressible indicating patency. An image is recorded for the permanent record.  Access to the vein with a micropuncture needle is done under direct ultrasound visualization.  1% lidocaine is infiltrated into the soft tissue at the base of the neck as well as on the chest wall.  Under direct ultrasound visualization a micro-needle is inserted into the vein followed by the micro-wire. Micro-sheath was then advanced and a J wire is inserted without difficulty under fluoroscopic guidance. A small counterincision was created at the wire insertion site. A transverse incision is created 2 fingerbreadths below the scapula and a pocket is fashioned using  both blunt and sharp dissection. The pocket is tested for appropriate size with the hub of the Infuse-a-Port. The tunneling device is then used to pull the intravascular portion of the catheter from the pocket to the neck counterincision.  Dilator and peel-away sheath were then inserted over the wire and the wire is removed. Catheter is then advanced into the venous system without difficulty. Peel-away sheath was then removed.  Catheter is then positioned under fluoroscopic guidance at the atrial caval junction. It is then transected connected to the hub and the hope is slipped into the subcutaneous pocket on the chest wall. The hub was then accessed percutaneously and aspirates easily and flushes well and is flushed with 30 cc of heparinized saline. The pocket incision is then closed in layers using interrupted 3-0 Vicryl for the subcutaneous tissues and 4-0 Monocryl subcuticular for skin closure. Dermabond is applied. The neck counterincision was closed with 4-0 Monocryl subcuticular and Dermabond as well.  The patient tolerated the procedure well and there were no immediate complications.  COMPLICATIONS: None  CONDITION: Unchanged  Katha Cabal M.D. [facility] Office: [phone]   01/13/2017, 8:40 AM

## 2017-01-14 ENCOUNTER — Encounter: Payer: Self-pay | Admitting: Vascular Surgery

## 2017-01-14 ENCOUNTER — Inpatient Hospital Stay: Payer: Medicare Other

## 2017-01-15 NOTE — Patient Instructions (Signed)
Docetaxel injection What is this medicine? DOCETAXEL (doe se TAX el) is a chemotherapy drug. It targets fast dividing cells, like cancer cells, and causes these cells to die. This medicine is used to treat many types of cancers like breast cancer, certain stomach cancers, head and neck cancer, lung cancer, and prostate cancer. This medicine may be used for other purposes; ask your health care provider or pharmacist if you have questions. COMMON BRAND NAME(S): Docefrez, Taxotere What should I tell my health care provider before I take this medicine? They need to know if you have any of these conditions: -infection (especially a virus infection such as chickenpox, cold sores, or herpes) -liver disease -low blood counts, like low white cell, platelet, or red cell counts -an unusual or allergic reaction to docetaxel, polysorbate 80, other chemotherapy agents, other medicines, foods, dyes, or preservatives -pregnant or trying to get pregnant -breast-feeding How should I use this medicine? This drug is given as an infusion into a vein. It is administered in a hospital or clinic by a specially trained health care professional. Talk to your pediatrician regarding the use of this medicine in children. Special care may be needed. Overdosage: If you think you have taken too much of this medicine contact a poison control center or emergency room at once. NOTE: This medicine is only for you. Do not share this medicine with others. What if I miss a dose? It is important not to miss your dose. Call your doctor or health care professional if you are unable to keep an appointment. What may interact with this medicine? -cyclosporine -erythromycin -ketoconazole -medicines to increase blood counts like filgrastim, pegfilgrastim, sargramostim -vaccines Talk to your doctor or health care professional before taking any of these medicines: -acetaminophen -aspirin -ibuprofen -ketoprofen -naproxen This list  may not describe all possible interactions. Give your health care provider a list of all the medicines, herbs, non-prescription drugs, or dietary supplements you use. Also tell them if you smoke, drink alcohol, or use illegal drugs. Some items may interact with your medicine. What should I watch for while using this medicine? Your condition will be monitored carefully while you are receiving this medicine. You will need important blood work done while you are taking this medicine. This drug may make you feel generally unwell. This is not uncommon, as chemotherapy can affect healthy cells as well as cancer cells. Report any side effects. Continue your course of treatment even though you feel ill unless your doctor tells you to stop. In some cases, you may be given additional medicines to help with side effects. Follow all directions for their use. Call your doctor or health care professional for advice if you get a fever, chills or sore throat, or other symptoms of a cold or flu. Do not treat yourself. This drug decreases your body's ability to fight infections. Try to avoid being around people who are sick. This medicine may increase your risk to bruise or bleed. Call your doctor or health care professional if you notice any unusual bleeding. This medicine may contain alcohol in the product. You may get drowsy or dizzy. Do not drive, use machinery, or do anything that needs mental alertness until you know how this medicine affects you. Do not stand or sit up quickly, especially if you are an older patient. This reduces the risk of dizzy or fainting spells. Avoid alcoholic drinks. Do not become pregnant while taking this medicine. Women should inform their doctor if they wish to become pregnant or   think they might be pregnant. There is a potential for serious side effects to an unborn child. Talk to your health care professional or pharmacist for more information. Do not breast-feed an infant while taking  this medicine. What side effects may I notice from receiving this medicine? Side effects that you should report to your doctor or health care professional as soon as possible: -allergic reactions like skin rash, itching or hives, swelling of the face, lips, or tongue -low blood counts - This drug may decrease the number of white blood cells, red blood cells and platelets. You may be at increased risk for infections and bleeding. -signs of infection - fever or chills, cough, sore throat, pain or difficulty passing urine -signs of decreased platelets or bleeding - bruising, pinpoint red spots on the skin, black, tarry stools, nosebleeds -signs of decreased red blood cells - unusually weak or tired, fainting spells, lightheadedness -breathing problems -fast or irregular heartbeat -low blood pressure -mouth sores -nausea and vomiting -pain, swelling, redness or irritation at the injection site -pain, tingling, numbness in the hands or feet -swelling of the ankle, feet, hands -weight gain Side effects that usually do not require medical attention (report to your doctor or health care professional if they continue or are bothersome): -bone pain -complete hair loss including hair on your head, underarms, pubic hair, eyebrows, and eyelashes -diarrhea -excessive tearing -changes in the color of fingernails -loosening of the fingernails -nausea -muscle pain -red flush to skin -sweating -weak or tired This list may not describe all possible side effects. Call your doctor for medical advice about side effects. You may report side effects to FDA at 1-800-FDA-1088. Where should I keep my medicine? This drug is given in a hospital or clinic and will not be stored at home. NOTE: This sheet is a summary. It may not cover all possible information. If you have questions about this medicine, talk to your doctor, pharmacist, or health care provider.  2018 Elsevier/Gold Standard (2015-10-24 12:32:56)  

## 2017-01-19 ENCOUNTER — Inpatient Hospital Stay: Payer: Medicare Other

## 2017-01-21 ENCOUNTER — Inpatient Hospital Stay: Payer: Medicare Other

## 2017-01-21 ENCOUNTER — Encounter: Payer: Self-pay | Admitting: Oncology

## 2017-01-21 ENCOUNTER — Other Ambulatory Visit: Payer: Self-pay | Admitting: Oncology

## 2017-01-21 ENCOUNTER — Inpatient Hospital Stay (HOSPITAL_BASED_OUTPATIENT_CLINIC_OR_DEPARTMENT_OTHER): Payer: Medicare Other | Admitting: Oncology

## 2017-01-21 VITALS — BP 159/85 | HR 73 | Resp 18

## 2017-01-21 VITALS — BP 159/87 | HR 76 | Temp 96.2°F | Resp 18 | Wt 161.9 lb

## 2017-01-21 DIAGNOSIS — B1920 Unspecified viral hepatitis C without hepatic coma: Secondary | ICD-10-CM | POA: Diagnosis not present

## 2017-01-21 DIAGNOSIS — Z8673 Personal history of transient ischemic attack (TIA), and cerebral infarction without residual deficits: Secondary | ICD-10-CM | POA: Diagnosis not present

## 2017-01-21 DIAGNOSIS — Z7951 Long term (current) use of inhaled steroids: Secondary | ICD-10-CM

## 2017-01-21 DIAGNOSIS — M545 Low back pain: Secondary | ICD-10-CM | POA: Diagnosis not present

## 2017-01-21 DIAGNOSIS — Z87891 Personal history of nicotine dependence: Secondary | ICD-10-CM

## 2017-01-21 DIAGNOSIS — C61 Malignant neoplasm of prostate: Secondary | ICD-10-CM | POA: Diagnosis not present

## 2017-01-21 DIAGNOSIS — I10 Essential (primary) hypertension: Secondary | ICD-10-CM

## 2017-01-21 DIAGNOSIS — Z5111 Encounter for antineoplastic chemotherapy: Secondary | ICD-10-CM

## 2017-01-21 DIAGNOSIS — M5116 Intervertebral disc disorders with radiculopathy, lumbar region: Secondary | ICD-10-CM

## 2017-01-21 DIAGNOSIS — Z79899 Other long term (current) drug therapy: Secondary | ICD-10-CM | POA: Diagnosis not present

## 2017-01-21 DIAGNOSIS — C7951 Secondary malignant neoplasm of bone: Principal | ICD-10-CM

## 2017-01-21 DIAGNOSIS — Z7982 Long term (current) use of aspirin: Secondary | ICD-10-CM | POA: Diagnosis not present

## 2017-01-21 DIAGNOSIS — E785 Hyperlipidemia, unspecified: Secondary | ICD-10-CM

## 2017-01-21 DIAGNOSIS — G8929 Other chronic pain: Secondary | ICD-10-CM | POA: Diagnosis not present

## 2017-01-21 LAB — COMPREHENSIVE METABOLIC PANEL
ALBUMIN: 3.7 g/dL (ref 3.5–5.0)
ALK PHOS: 383 U/L — AB (ref 38–126)
ALT: 27 U/L (ref 17–63)
AST: 35 U/L (ref 15–41)
Anion gap: 2 — ABNORMAL LOW (ref 5–15)
BUN: 11 mg/dL (ref 6–20)
CALCIUM: 8.9 mg/dL (ref 8.9–10.3)
CO2: 27 mmol/L (ref 22–32)
CREATININE: 0.71 mg/dL (ref 0.61–1.24)
Chloride: 109 mmol/L (ref 101–111)
GFR calc Af Amer: >60 mL/min (ref >60)
GLUCOSE: 108 mg/dL — AB (ref 65–99)
Potassium: 3.8 mmol/L (ref 3.5–5.1)
Sodium: 138 mmol/L (ref 135–145)
TOTAL PROTEIN: 7 g/dL (ref 6.5–8.1)
Total Bilirubin: 0.5 mg/dL (ref 0.3–1.2)

## 2017-01-21 LAB — CBC WITH DIFFERENTIAL/PLATELET
Basophils Absolute: 0 10*3/uL (ref 0–0.1)
Basophils Relative: 1 %
Eosinophils Absolute: 0.1 10*3/uL (ref 0–0.7)
Eosinophils Relative: 2 %
HEMATOCRIT: 31.8 % — AB (ref 40.0–52.0)
HEMOGLOBIN: 10.7 g/dL — AB (ref 13.0–18.0)
Lymphocytes Relative: 21 %
Lymphs Abs: 1 10*3/uL (ref 1.0–3.6)
MCH: 29.3 pg (ref 26.0–34.0)
MCHC: 33.8 g/dL (ref 32.0–36.0)
MCV: 86.9 fL (ref 80.0–100.0)
MONO ABS: 0.4 10*3/uL (ref 0.2–1.0)
MONOS PCT: 8 %
NEUTROS ABS: 3.2 10*3/uL (ref 1.4–6.5)
NEUTROS PCT: 68 %
Platelets: 230 10*3/uL (ref 150–440)
RBC: 3.66 MIL/uL — ABNORMAL LOW (ref 4.40–5.90)
RDW: 18.6 % — AB (ref 11.5–14.5)
WBC: 4.7 10*3/uL (ref 3.8–10.6)

## 2017-01-21 MED ORDER — DEXAMETHASONE SODIUM PHOSPHATE 10 MG/ML IJ SOLN
10.0000 mg | Freq: Once | INTRAMUSCULAR | Status: AC
  Administered 2017-01-21: 10 mg via INTRAVENOUS
  Filled 2017-01-21: qty 1

## 2017-01-21 MED ORDER — HEPARIN SOD (PORK) LOCK FLUSH 100 UNIT/ML IV SOLN
500.0000 [IU] | Freq: Once | INTRAVENOUS | Status: AC
  Administered 2017-01-21: 500 [IU] via INTRAVENOUS

## 2017-01-21 MED ORDER — SODIUM CHLORIDE 0.9% FLUSH
10.0000 mL | INTRAVENOUS | Status: DC | PRN
  Administered 2017-01-21: 10 mL via INTRAVENOUS
  Filled 2017-01-21: qty 10

## 2017-01-21 MED ORDER — HEPARIN SOD (PORK) LOCK FLUSH 100 UNIT/ML IV SOLN: 500.0000 [IU] | Freq: Once | INTRAVENOUS | Status: DC | PRN

## 2017-01-21 MED ORDER — SODIUM CHLORIDE 0.9 % IV SOLN
Freq: Once | INTRAVENOUS | Status: AC
  Administered 2017-01-21: 09:00:00 via INTRAVENOUS
  Filled 2017-01-21: qty 1000

## 2017-01-21 MED ORDER — SODIUM CHLORIDE 0.9% FLUSH
10.0000 mL | INTRAVENOUS | Status: DC | PRN
  Filled 2017-01-21: qty 10

## 2017-01-21 MED ORDER — SODIUM CHLORIDE 0.9 % IV SOLN
75.0000 mg/m2 | Freq: Once | INTRAVENOUS | Status: AC
  Administered 2017-01-21: 140 mg via INTRAVENOUS
  Filled 2017-01-21: qty 14

## 2017-01-21 MED ORDER — PEGFILGRASTIM 6 MG/0.6ML ~~LOC~~ PSKT
6.0000 mg | PREFILLED_SYRINGE | Freq: Once | SUBCUTANEOUS | Status: AC
  Administered 2017-01-21: 6 mg via SUBCUTANEOUS
  Filled 2017-01-21: qty 0.6

## 2017-01-21 NOTE — Progress Notes (Signed)
Here today prior to chemo tx number 1

## 2017-01-21 NOTE — Progress Notes (Signed)
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 Hollice Espy for elevated PSA. PSA on 12/03/2016 was 362.9 and patient was also found to have enlarged hard nodular prostate involving the left half of the gland.patient received his first dose of Firmagon injection on 12/07/2016. He also underwent prostate biopsy which showedprosthetic adenocarcinoma in 4 out of 6 cores submitted. Gleason score of 7.  CT abdomen pelvis from 12/11/2016 showed diffuse sclerotic osseous metastases throughout the actual and appendicular skeleton. Subcentimeter low attenuation lesions in the kidney and liver too small to characterized but favored to represent cysts.bone scan showed widespread bony metastatic disease involving bilateral scapula, cervical/thoracic/lumbar spine, pelvic bones, proximal femurs and ribs and likely the calvarium.Nearly super scan appearance.patient reports that he has lost about 20 pounds of weightover the last few months.   He lives with his wife and extended family at home and is independent of his ADLs and IADLs.he reports chronic low back pain but denies other complaints  CT thorax showed no lung or LN mets.   Risks and benefits of both abiraterone and docetaxel were discussed with the patient and he chose to proceed with docetaxel  Interval history- doing well and continues to eb active. He mowed his lawn yesterday  ECOG PS- 1 Pain scale- 0 Opioid associated constipation- n/a  Review of systems- Review of  Systems  Constitutional: Negative for chills, fever, malaise/fatigue and weight loss.  HENT: Negative for congestion, ear discharge and nosebleeds.   Eyes: Negative for blurred vision.  Respiratory: Negative for cough, hemoptysis, sputum production, shortness of breath and wheezing.   Cardiovascular: Negative for chest pain, palpitations, orthopnea and claudication.  Gastrointestinal: Negative for abdominal pain, blood in stool, constipation, diarrhea, heartburn, melena, nausea and vomiting.  Genitourinary: Negative for dysuria, flank pain, frequency, hematuria and urgency.  Musculoskeletal: Negative for back pain, joint pain and myalgias.  Skin: Negative for rash.  Neurological: Negative for dizziness, tingling, focal weakness, seizures, weakness and headaches.  Endo/Heme/Allergies: Does not bruise/bleed easily.  Psychiatric/Behavioral: Negative for depression and suicidal ideas. The patient does not have insomnia.      No Known Allergies   Past Medical History:  Diagnosis Date  . Arthritis   . Hepatitis C   . Hyperlipidemia   . Hypertension   . Prostate cancer ([facility]) 12/2016  . Stroke [facility])    w left sided weakness per pt (2017)     Past Surgical History:  Procedure Laterality Date  . PORTA CATH INSERTION N/A 01/12/2017   Procedure: Glori Luis Cath Insertion;  Surgeon: Katha Cabal, MD;  Location: [facility];  Service: Cardiovascular;  Laterality: N/A;    Social History   Social History  . Marital status: Married    Spouse name: N/A  . Number of children: N/A  . Years of education: N/A   Occupational History  . Not on file.   Social History Main Topics  . Smoking status: Former Smoker    Packs/day: 0.25    Years: 20.00    Types: Cigarettes    Quit  date: 01/07/1997  . Smokeless tobacco: Never Used  . Alcohol use No  . Drug use: No  . Sexual activity: No   Other Topics Concern  . Not on file   Social History Narrative  . No narrative on file     Family History  Problem Relation Age of Onset  . Cancer Sister   . Prostatitis Brother   . Prostatitis Brother   . Prostate cancer Neg Hx   . Bladder Cancer Neg Hx   . Kidney cancer Neg Hx      Current Outpatient Prescriptions:  .  aspirin EC 81 MG tablet, Take 81 mg by mouth., Disp: , Rfl:  .  atorvastatin (LIPITOR) 40 MG tablet, Take 40 mg by mouth at bedtime. , Disp: , Rfl:  .  cyclobenzaprine (FLEXERIL) 10 MG tablet, TAKE ONE TABLET BY MOUTH AT NIGHT AS NEEDED FOR MUSCLE SPASMS, Disp: , Rfl:  .  dexamethasone (DECADRON) 4 MG tablet, Take 2 tablets (8 mg total) by mouth 2 (two) times daily. Start the day before Taxotere. Then daily after chemo for 2 days., Disp: 30 tablet, Rfl: 1 .  gabapentin (NEURONTIN) 300 MG capsule, Take 300 mg by mouth 2 (two) times daily. , Disp: , Rfl:  .  HYDROcodone-acetaminophen (NORCO) 7.5-325 MG tablet, Take 1 tablet by mouth every 6 (six) hours as needed for moderate pain. , Disp: , Rfl:  .  lidocaine-prilocaine (EMLA) cream, Apply to affected area once, Disp: 30 g, Rfl: 3 .  lisinopril (PRINIVIL,ZESTRIL) 5 MG tablet, Take 5 mg by mouth daily. , Disp: , Rfl:  .  LORazepam (ATIVAN) 0.5 MG tablet, Take 1 tablet (0.5 mg total) by mouth every 6 (six) hours as needed (Nausea or vomiting)., Disp: 30 tablet, Rfl: 0 .  mirtazapine (REMERON) 15 MG tablet, Take 15 mg by mouth at bedtime. , Disp: , Rfl:  .  ondansetron (ZOFRAN) 8 MG tablet, Take 1 tablet (8 mg total) by mouth 2 (two) times daily as needed for refractory nausea / vomiting., Disp: 30 tablet, Rfl: 1 .  prochlorperazine (COMPAZINE) 10 MG tablet, Take 1 tablet (10 mg total) by mouth every 6 (six) hours as needed (Nausea or vomiting)., Disp: 30 tablet, Rfl: 1 No current facility-administered medications for this visit.   Facility-Administered Medications Ordered in Other Visits:  .  dexamethasone (DECADRON) injection 10 mg, 10 mg, Intravenous, Once, Sindy Guadeloupe, MD .  DOCEtaxel (TAXOTERE) 140 mg  in sodium chloride 0.9 % 250 mL chemo infusion, 75 mg/m2 (Treatment Plan Recorded), Intravenous, Once, Sindy Guadeloupe, MD .  heparin lock flush 100 unit/mL, 500 Units, Intravenous, Once, Sindy Guadeloupe, MD .  heparin lock flush 100 unit/mL, 500 Units, Intracatheter, Once PRN, Sindy Guadeloupe, MD .  pegfilgrastim (NEULASTA ONPRO KIT) injection 6 mg, 6 mg, Subcutaneous, Once, Sindy Guadeloupe, MD .  sodium chloride flush (NS) 0.9 % injection 10 mL, 10 mL, Intravenous, PRN, Sindy Guadeloupe, MD, 10 mL at 01/21/17 7893 .  sodium chloride flush (NS) 0.9 % injection 10 mL, 10 mL, Intracatheter, PRN, Sindy Guadeloupe, MD  Physical exam:  Vitals:   01/21/17 0848  BP: (!) 159/87  Pulse: 76  Resp: 18  Temp: (!) 96.2 F (35.7 C)  TempSrc: Tympanic  Weight: 161 lb 14.4 oz (73.4 kg)   Physical Exam  Constitutional: He is oriented to person, place, and time and well-developed, well-nourished, and in no distress.  HENT:  Head: Normocephalic and atraumatic.  Eyes: EOM are  normal. Pupils are equal, round, and reactive to light.  Neck: Normal range of motion.  Cardiovascular: Normal rate, regular rhythm and normal heart sounds.   Pulmonary/Chest: Effort normal and breath sounds normal.  Abdominal: Soft. Bowel sounds are normal.  Neurological: He is alert and oriented to person, place, and time.  Skin: Skin is warm and dry.     CMP Latest Ref Rng & Units 01/21/2017  Glucose 65 - 99 mg/dL 108(H)  BUN 6 - 20 mg/dL 11  Creatinine 0.61 - 1.24 mg/dL 0.71  Sodium 135 - 145 mmol/L 138  Potassium 3.5 - 5.1 mmol/L 3.8  Chloride 101 - 111 mmol/L 109  CO2 22 - 32 mmol/L 27  Calcium 8.9 - 10.3 mg/dL 8.9  Total Protein 6.5 - 8.1 g/dL 7.0  Total Bilirubin 0.3 - 1.2 mg/dL 0.5  Alkaline Phos 38 - 126 U/L 383(H)  AST 15 - 41 U/L 35  ALT 17 - 63 U/L 27   CBC Latest Ref Rng & Units 01/21/2017  WBC 3.8 - 10.6 K/uL 4.7  Hemoglobin 13.0 - 18.0 g/dL 10.7(L)  Hematocrit 40.0 - 52.0 % 31.8(L)  Platelets 150 - 440 K/uL 230     No images are attached to the encounter.  Ct Chest W Contrast  Result Date: 01/13/2017 CLINICAL DATA:  Prostate cancer. EXAM: CT CHEST WITH CONTRAST TECHNIQUE: Multidetector CT imaging of the chest was performed during intravenous contrast administration. CONTRAST:  23m ISOVUE-300 IOPAMIDOL (ISOVUE-300) INJECTION 61% COMPARISON:  Bone scan 12/11/2016 and CT abdomen pelvis 12/11/2016. FINDINGS: Cardiovascular: There is subcutaneous air in the right infraclavicular region related to recent placement of a right IJ Port-A-Cath which terminates at the SVC RA junction. Atherosclerotic calcification of the arterial vasculature, including coronary arteries. Heart size normal. No pericardial effusion. Mediastinum/Nodes: Mediastinal and hilar lymph nodes are not enlarged by CT size criteria. No axillary adenopathy. Esophagus is grossly unremarkable. Lungs/Pleura: Minimal biapical pleuroparenchymal scarring. Lungs are clear. No pleural fluid. Airway is unremarkable. Upper Abdomen: Visualized portions of the liver, gallbladder, adrenal glands, kidneys, spleen, pancreas, stomach and bowel are grossly unremarkable. No upper abdominal adenopathy. Musculoskeletal: Mottled sclerosis is seen throughout the visualized osseous structures. IMPRESSION: 1. No pulmonary parenchymal metastatic disease. Diffuse osseous metastatic disease. 2. Aortic atherosclerosis (ICD10-170.0). Coronary artery calcification. Electronically Signed   By: MLorin PicketM.D.   On: 01/13/2017 12:19     Assessment and plan- Patient is a 71y.o. 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 Counts ok to proceed with cycle # 1 of docetaxel today. He will get on pro neulasta and he knows to take tylenol and claritin for neulasta associated bone pain. I discussed the risks and benefits of docetaxel again including but not limited to nausea vomiting, risk of low blood counts and infections as well as  peripheral neuropathy. Patient understands and agrees to proceed. Patient already has a port in place and his port site looks good without any signs of infection. I will see him back in 1 week time to see how he is tolerating his docetaxel. [facility] His bone density scan has been ordered and is currently pending   Visit Diagnosis 1. [place] cancer [place] to [place] ([place]   2. Encounter for antineoplastic chemotherapy      Dr. ARanda Evens MD, MPH [facility]at APrattville Baptist HospitalPager- [phone]/19/2018 9:27 AM

## 2017-01-28 ENCOUNTER — Inpatient Hospital Stay (HOSPITAL_BASED_OUTPATIENT_CLINIC_OR_DEPARTMENT_OTHER): Payer: Medicare Other | Admitting: Oncology

## 2017-01-28 ENCOUNTER — Encounter: Payer: Self-pay | Admitting: Oncology

## 2017-01-28 ENCOUNTER — Inpatient Hospital Stay: Payer: Medicare Other

## 2017-01-28 VITALS — BP 159/77 | HR 81 | Temp 96.7°F | Resp 18 | Wt 158.8 lb

## 2017-01-28 DIAGNOSIS — Z7982 Long term (current) use of aspirin: Secondary | ICD-10-CM

## 2017-01-28 DIAGNOSIS — C7951 Secondary malignant neoplasm of bone: Secondary | ICD-10-CM | POA: Diagnosis not present

## 2017-01-28 DIAGNOSIS — E785 Hyperlipidemia, unspecified: Secondary | ICD-10-CM

## 2017-01-28 DIAGNOSIS — I10 Essential (primary) hypertension: Secondary | ICD-10-CM

## 2017-01-28 DIAGNOSIS — M545 Low back pain: Secondary | ICD-10-CM

## 2017-01-28 DIAGNOSIS — B370 Candidal stomatitis: Secondary | ICD-10-CM

## 2017-01-28 DIAGNOSIS — Z87891 Personal history of nicotine dependence: Secondary | ICD-10-CM

## 2017-01-28 DIAGNOSIS — R634 Abnormal weight loss: Secondary | ICD-10-CM

## 2017-01-28 DIAGNOSIS — M5116 Intervertebral disc disorders with radiculopathy, lumbar region: Secondary | ICD-10-CM

## 2017-01-28 DIAGNOSIS — Z8673 Personal history of transient ischemic attack (TIA), and cerebral infarction without residual deficits: Secondary | ICD-10-CM

## 2017-01-28 DIAGNOSIS — C61 Malignant neoplasm of prostate: Secondary | ICD-10-CM | POA: Diagnosis not present

## 2017-01-28 DIAGNOSIS — B1920 Unspecified viral hepatitis C without hepatic coma: Secondary | ICD-10-CM

## 2017-01-28 DIAGNOSIS — G8929 Other chronic pain: Secondary | ICD-10-CM

## 2017-01-28 DIAGNOSIS — Z79899 Other long term (current) drug therapy: Secondary | ICD-10-CM

## 2017-01-28 DIAGNOSIS — Z5111 Encounter for antineoplastic chemotherapy: Secondary | ICD-10-CM | POA: Diagnosis not present

## 2017-01-28 DIAGNOSIS — I69354 Hemiplegia and hemiparesis following cerebral infarction affecting left non-dominant side: Secondary | ICD-10-CM

## 2017-01-28 LAB — COMPREHENSIVE METABOLIC PANEL
ALBUMIN: 3.6 g/dL (ref 3.5–5.0)
ALT: 24 U/L (ref 17–63)
AST: 33 U/L (ref 15–41)
Alkaline Phosphatase: 343 U/L — ABNORMAL HIGH (ref 38–126)
Anion gap: 7 (ref 5–15)
BILIRUBIN TOTAL: 0.6 mg/dL (ref 0.3–1.2)
BUN: 30 mg/dL — AB (ref 6–20)
CHLORIDE: 102 mmol/L (ref 101–111)
CO2: 25 mmol/L (ref 22–32)
CREATININE: 0.97 mg/dL (ref 0.61–1.24)
Calcium: 8.9 mg/dL (ref 8.9–10.3)
GFR calc Af Amer: >60 mL/min (ref >60)
GLUCOSE: 119 mg/dL — AB (ref 65–99)
Potassium: 4.5 mmol/L (ref 3.5–5.1)
Sodium: 134 mmol/L — ABNORMAL LOW (ref 135–145)
Total Protein: 6.7 g/dL (ref 6.5–8.1)

## 2017-01-28 LAB — CBC WITH DIFFERENTIAL/PLATELET
BASOS ABS: 0.1 10*3/uL (ref 0–0.1)
BASOS PCT: 0 %
Eosinophils Absolute: 0 10*3/uL (ref 0–0.7)
Eosinophils Relative: 0 %
HEMATOCRIT: 33.2 % — AB (ref 40.0–52.0)
Hemoglobin: 11.1 g/dL — ABNORMAL LOW (ref 13.0–18.0)
LYMPHS PCT: 8 %
Lymphs Abs: 2 10*3/uL (ref 1.0–3.6)
MCH: 28.9 pg (ref 26.0–34.0)
MCHC: 33.3 g/dL (ref 32.0–36.0)
MCV: 86.9 fL (ref 80.0–100.0)
Monocytes Absolute: 1.3 10*3/uL — ABNORMAL HIGH (ref 0.2–1.0)
Monocytes Relative: 5 %
NEUTROS ABS: 22.8 10*3/uL — AB (ref 1.4–6.5)
NEUTROS PCT: 87 %
PLATELETS: 262 10*3/uL (ref 150–440)
RBC: 3.82 MIL/uL — AB (ref 4.40–5.90)
RDW: 19.3 % — ABNORMAL HIGH (ref 11.5–14.5)
WBC: 26.2 10*3/uL — AB (ref 3.8–10.6)

## 2017-01-28 MED ORDER — FLUCONAZOLE 100 MG PO TABS: 100.0000 mg | ORAL_TABLET | ORAL | 0 refills | Status: DC

## 2017-01-28 NOTE — Progress Notes (Signed)
Here for follow up. Stated " feeling good " 

## 2017-01-28 NOTE — Progress Notes (Signed)
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 Hollice Espy for elevated PSA. PSA on 12/03/2016 was 362.9 and patient was also found to have enlarged hard nodular prostate involving the left half of the gland.patient received his first dose of Firmagon injection on 12/07/2016. He also underwent prostate biopsy which showedprosthetic adenocarcinoma in 4 out of 6 cores submitted. Gleason score of 7.  CT abdomen pelvis from 12/11/2016 showed diffuse sclerotic osseous metastases throughout the actual and appendicular skeleton. Subcentimeter low attenuation lesions in the kidney and liver too small to characterized but favored to represent cysts.bone scan showed widespread bony metastatic disease involving bilateral scapula, cervical/thoracic/lumbar spine, pelvic bones, proximal femurs and ribs and likely the calvarium.Nearly super scan appearance.patient reports that he has lost about 20 pounds of weightover the last few months.   He lives with his wife and extended family at home and is independent of his ADLs and IADLs.he reports chronic low back pain but denies other complaints  CT thorax showed no lung or LN mets.   Risks and benefits of both abiraterone and docetaxel were discussed with the patient and he chose to proceed with docetaxel  Interval history- Doing well and continues to be active.  He complains of cough and runny nose that started 2 days after cycle #1 of docetaxel.  He reports chronic left-sided weakness from a  previous stroke. He denies fatigue, chest pain, shortness of breath, fever, chills, or recent illnesses.  He reports urinary frequency.  He denies nausea, vomiting, constipation, diarrhea, hematuria, hematochezia, or melena.  He denies numbness or tingling, headache, hot flashes or night sweats. He denies bleeding, bruising, skin changes or itching. He reports sleeping well.  He denies any pain currently.  His appetite is good and he has no trouble swallowing.  ECOG PS- 1 Pain scale- 0 Opioid associated constipation- n/a  Review of systems- Review of Systems  Constitutional: Negative for chills, fever, malaise/fatigue and weight loss.  HENT: Negative for congestion, ear discharge and nosebleeds.        Runny nose  Eyes: Negative for blurred vision.  Respiratory: Positive for cough. Negative for hemoptysis, shortness of breath and wheezing.   Cardiovascular: Negative for chest pain, palpitations, orthopnea, claudication and leg swelling.  Gastrointestinal: Negative for abdominal pain, blood in stool, constipation, diarrhea, heartburn, melena, nausea and vomiting.  Genitourinary: Positive for frequency. Negative for dysuria, flank pain, hematuria and urgency.  Musculoskeletal: Positive for back pain. Negative for joint pain and myalgias.  Skin: Negative.  Negative for itching and rash.  Neurological: Negative for dizziness, tingling, focal weakness, seizures, weakness and headaches.  Endo/Heme/Allergies: Does not bruise/bleed easily.  Psychiatric/Behavioral: Negative for depression and suicidal ideas. The patient does not have insomnia.      No Known Allergies   Past Medical History:  Diagnosis Date  . Arthritis   . Hepatitis C   . Hyperlipidemia   . Hypertension   . Prostate cancer ([place]) 12/2016  . Stroke [facility])    w left sided weakness per pt (2017)     Past Surgical  History:  Procedure Laterality Date  . PORTA CATH INSERTION N/A 01/12/2017   Procedure: Glori Luis Cath Insertion;   Surgeon: Katha Cabal, MD;  Location: [place] CV LAB;  Service: Cardiovascular;  Laterality: N/A;    Social History   Social History  . Marital status: Married    Spouse name: N/A  . Number of children: N/A  . Years of education: N/A   Occupational History  . Not on file.   Social History Main Topics  . Smoking status: Former Smoker    Packs/day: 0.25    Years: 20.00    Types: Cigarettes    Quit date: 01/07/1997  . Smokeless tobacco: Never Used  . Alcohol use No  . Drug use: No  . Sexual activity: No   Other Topics Concern  . Not on file   Social History Narrative  . No narrative on file    Family History  Problem Relation Age of Onset  . Cancer Sister   . Prostatitis Brother   . Prostatitis Brother   . Prostate cancer Neg Hx   . Bladder Cancer Neg Hx   . Kidney cancer Neg Hx      Current Outpatient Prescriptions:  .  aspirin EC 81 MG tablet, Take 81 mg by mouth., Disp: , Rfl:  .  atorvastatin (LIPITOR) 40 MG tablet, Take 40 mg by mouth at bedtime. , Disp: , Rfl:  .  cyclobenzaprine (FLEXERIL) 10 MG tablet, TAKE ONE TABLET BY MOUTH AT NIGHT AS NEEDED FOR MUSCLE SPASMS, Disp: , Rfl:  .  dexamethasone (DECADRON) 4 MG tablet, Take 2 tablets (8 mg total) by mouth 2 (two) times daily. Start the day before Taxotere. Then daily after chemo for 2 days., Disp: 30 tablet, Rfl: 1 .  gabapentin (NEURONTIN) 300 MG capsule, Take 300 mg by mouth 2 (two) times daily. , Disp: , Rfl:  .  HYDROcodone-acetaminophen (NORCO) 7.5-325 MG tablet, Take 1 tablet by mouth every 6 (six) hours as needed for moderate pain. , Disp: , Rfl:  .  lidocaine-prilocaine (EMLA) cream, Apply to affected area once, Disp: 30 g, Rfl: 3 .  lisinopril (PRINIVIL,ZESTRIL) 5 MG tablet, Take 5 mg by mouth daily. , Disp: , Rfl:  .  LORazepam (ATIVAN) 0.5 MG tablet, Take 1 tablet (0.5 mg total) by mouth every 6 (six) hours as needed (Nausea or vomiting)., Disp: 30 tablet, Rfl: 0 .  mirtazapine (REMERON) 15  MG tablet, Take 15 mg by mouth at bedtime. , Disp: , Rfl:  .  ondansetron (ZOFRAN) 8 MG tablet, Take 1 tablet (8 mg total) by mouth 2 (two) times daily as needed for refractory nausea / vomiting., Disp: 30 tablet, Rfl: 1 .  prochlorperazine (COMPAZINE) 10 MG tablet, Take 1 tablet (10 mg total) by mouth every 6 (six) hours as needed (Nausea or vomiting)., Disp: 30 tablet, Rfl: 1 .  fluconazole (DIFLUCAN) 100 MG tablet, Take 1 tablet (100 mg total) by mouth as directed. Take 2 tablets on first day and then 1 tablet a day for 7 more days, Disp: 9 tablet, Rfl: 0  Physical exam:  Vitals:   01/28/17 1001  BP: (!) 159/77  Pulse: 81  Resp: 18  Temp: (!) 96.7 F (35.9 C)  TempSrc: Tympanic  Weight: 158 lb 12.8 oz (72 kg)   Physical Exam  Constitutional: He is oriented to person, place, and time and well-developed, well-nourished, and in no distress.  HENT:  Head: Normocephalic and atraumatic.  Lesions on right oropharynx  Eyes: EOM are normal. Pupils are equal, round, and reactive to light.  Neck: Normal range of motion.  Cardiovascular: Normal rate, regular rhythm and normal heart sounds.   Pulmonary/Chest: Effort normal and breath sounds normal.  Abdominal: Soft. Bowel sounds are normal.  Neurological: He is alert and oriented to person, place, and time.  Skin: Skin is warm and dry.  Psychiatric: Mood and affect normal.     CMP Latest Ref Rng & Units 01/28/2017  Glucose 65 - 99 mg/dL 119(H)  BUN 6 - 20 mg/dL 30(H)  Creatinine 0.61 - 1.24 mg/dL 0.97  Sodium 135 - 145 mmol/L 134(L)  Potassium 3.5 - 5.1 mmol/L 4.5  Chloride 101 - 111 mmol/L 102  CO2 22 - 32 mmol/L 25  Calcium 8.9 - 10.3 mg/dL 8.9  Total Protein 6.5 - 8.1 g/dL 6.7  Total Bilirubin 0.3 - 1.2 mg/dL 0.6  Alkaline Phos 38 - 126 U/L 343(H)  AST 15 - 41 U/L 33  ALT 17 - 63 U/L 24   CBC Latest Ref Rng & Units 01/28/2017  WBC 3.8 - 10.6 K/uL 26.2(H)  Hemoglobin 13.0 - 18.0 g/dL 11.1(L)  Hematocrit 40.0 - 52.0 % 33.2(L)    Platelets 150 - 440 K/uL 262    No images are attached to the encounter.  Ct Chest W Contrast  Result Date: 01/13/2017 CLINICAL DATA:  Prostate cancer. EXAM: CT CHEST WITH CONTRAST TECHNIQUE: Multidetector CT imaging of the chest was performed during intravenous contrast administration. CONTRAST:  23mL ISOVUE-300 IOPAMIDOL (ISOVUE-300) INJECTION 61% COMPARISON:  Bone scan 12/11/2016 and CT abdomen pelvis 12/11/2016. FINDINGS: Cardiovascular: There is subcutaneous air in the right infraclavicular region related to recent placement of a right IJ Port-A-Cath which terminates at the SVC RA junction. Atherosclerotic calcification of the arterial vasculature, including coronary arteries. Heart size normal. No pericardial effusion. Mediastinum/Nodes: Mediastinal and hilar lymph nodes are not enlarged by CT size criteria. No axillary adenopathy. Esophagus is grossly unremarkable. Lungs/Pleura: Minimal biapical pleuroparenchymal scarring. Lungs are clear. No pleural fluid. Airway is unremarkable. Upper Abdomen: Visualized portions of the liver, gallbladder, adrenal glands, kidneys, spleen, pancreas, stomach and bowel are grossly unremarkable. No upper abdominal adenopathy. Musculoskeletal: Mottled sclerosis is seen throughout the visualized osseous structures. IMPRESSION: 1. No pulmonary parenchymal metastatic disease. Diffuse osseous metastatic disease. 2. Aortic atherosclerosis (ICD10-170.0). Coronary artery calcification. Electronically Signed   By: Lorin Picket M.D.   On: 01/13/2017 12:19     Assessment and plan- Patient is a 71 y.o. 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 Patient tolerated cycle # 1 of docetaxel very well.  He noted a cough and runny nose starting 2 days after chemo.  He will continue to get [facility] through urology. His bone density scan has been scheduled for 02/15/2017.  2. Throat lesions/thrush: Rx: Fluconazole 100mg .  Take 2  tablets by mouth on first day, then 1 tablet a day for 7 more days.  3.  RTC in 2 weeks (02/11/2017) for labs (CBC, CMP), MD assessment, and Cycle #2 of 4 of Docetaxel + Neulasta OnPro.   Visit Diagnosis 1. Prostate cancer metastatic to bone ([place])      Lucendia Herrlich, NP  01/28/17 11:06 AM

## 2017-02-10 ENCOUNTER — Ambulatory Visit: Payer: Medicare Other | Admitting: Urology

## 2017-02-11 ENCOUNTER — Encounter: Payer: Self-pay | Admitting: Oncology

## 2017-02-11 ENCOUNTER — Inpatient Hospital Stay: Payer: Medicare Other | Attending: Oncology | Admitting: Oncology

## 2017-02-11 ENCOUNTER — Inpatient Hospital Stay: Payer: Medicare Other

## 2017-02-11 VITALS — BP 158/92 | HR 82 | Temp 98.0°F | Ht 71.5 in | Wt 156.3 lb

## 2017-02-11 VITALS — BP 158/85 | HR 83 | Resp 20

## 2017-02-11 DIAGNOSIS — R5383 Other fatigue: Secondary | ICD-10-CM | POA: Diagnosis not present

## 2017-02-11 DIAGNOSIS — Z87891 Personal history of nicotine dependence: Secondary | ICD-10-CM | POA: Insufficient documentation

## 2017-02-11 DIAGNOSIS — C61 Malignant neoplasm of prostate: Secondary | ICD-10-CM

## 2017-02-11 DIAGNOSIS — Z8673 Personal history of transient ischemic attack (TIA), and cerebral infarction without residual deficits: Secondary | ICD-10-CM | POA: Insufficient documentation

## 2017-02-11 DIAGNOSIS — C7951 Secondary malignant neoplasm of bone: Principal | ICD-10-CM

## 2017-02-11 DIAGNOSIS — Z8619 Personal history of other infectious and parasitic diseases: Secondary | ICD-10-CM | POA: Diagnosis not present

## 2017-02-11 DIAGNOSIS — R600 Localized edema: Secondary | ICD-10-CM | POA: Diagnosis not present

## 2017-02-11 DIAGNOSIS — R5381 Other malaise: Secondary | ICD-10-CM | POA: Insufficient documentation

## 2017-02-11 DIAGNOSIS — G8929 Other chronic pain: Secondary | ICD-10-CM | POA: Diagnosis not present

## 2017-02-11 DIAGNOSIS — Z7982 Long term (current) use of aspirin: Secondary | ICD-10-CM | POA: Insufficient documentation

## 2017-02-11 DIAGNOSIS — Z79899 Other long term (current) drug therapy: Secondary | ICD-10-CM

## 2017-02-11 DIAGNOSIS — Z5111 Encounter for antineoplastic chemotherapy: Secondary | ICD-10-CM

## 2017-02-11 DIAGNOSIS — I69354 Hemiplegia and hemiparesis following cerebral infarction affecting left non-dominant side: Secondary | ICD-10-CM | POA: Insufficient documentation

## 2017-02-11 DIAGNOSIS — E785 Hyperlipidemia, unspecified: Secondary | ICD-10-CM | POA: Diagnosis not present

## 2017-02-11 DIAGNOSIS — Z7689 Persons encountering health services in other specified circumstances: Secondary | ICD-10-CM | POA: Insufficient documentation

## 2017-02-11 DIAGNOSIS — D6481 Anemia due to antineoplastic chemotherapy: Secondary | ICD-10-CM | POA: Insufficient documentation

## 2017-02-11 DIAGNOSIS — R634 Abnormal weight loss: Secondary | ICD-10-CM | POA: Insufficient documentation

## 2017-02-11 DIAGNOSIS — I10 Essential (primary) hypertension: Secondary | ICD-10-CM | POA: Diagnosis not present

## 2017-02-11 DIAGNOSIS — G620 Drug-induced polyneuropathy: Secondary | ICD-10-CM | POA: Diagnosis not present

## 2017-02-11 DIAGNOSIS — M545 Low back pain: Secondary | ICD-10-CM | POA: Insufficient documentation

## 2017-02-11 DIAGNOSIS — Z809 Family history of malignant neoplasm, unspecified: Secondary | ICD-10-CM | POA: Diagnosis not present

## 2017-02-11 DIAGNOSIS — Z8042 Family history of malignant neoplasm of prostate: Secondary | ICD-10-CM

## 2017-02-11 DIAGNOSIS — T451X5S Adverse effect of antineoplastic and immunosuppressive drugs, sequela: Secondary | ICD-10-CM | POA: Diagnosis not present

## 2017-02-11 LAB — CBC WITH DIFFERENTIAL/PLATELET
BASOS PCT: 0 %
Basophils Absolute: 0 10*3/uL (ref 0–0.1)
EOS PCT: 0 %
Eosinophils Absolute: 0 10*3/uL (ref 0–0.7)
HCT: 33.8 % — ABNORMAL LOW (ref 40.0–52.0)
Hemoglobin: 11.5 g/dL — ABNORMAL LOW (ref 13.0–18.0)
Lymphocytes Relative: 7 %
Lymphs Abs: 0.4 10*3/uL — ABNORMAL LOW (ref 1.0–3.6)
MCH: 29.9 pg (ref 26.0–34.0)
MCHC: 33.9 g/dL (ref 32.0–36.0)
MCV: 88.3 fL (ref 80.0–100.0)
Monocytes Absolute: 0.1 10*3/uL — ABNORMAL LOW (ref 0.2–1.0)
Monocytes Relative: 2 %
NEUTROS PCT: 91 %
Neutro Abs: 6.2 10*3/uL (ref 1.4–6.5)
PLATELETS: 308 10*3/uL (ref 150–440)
RBC: 3.83 MIL/uL — AB (ref 4.40–5.90)
RDW: 19.4 % — ABNORMAL HIGH (ref 11.5–14.5)
WBC: 6.8 10*3/uL (ref 3.8–10.6)

## 2017-02-11 LAB — COMPREHENSIVE METABOLIC PANEL
ALBUMIN: 3.2 g/dL — AB (ref 3.5–5.0)
ALK PHOS: 140 U/L — AB (ref 38–126)
ALT: 35 U/L (ref 17–63)
AST: 28 U/L (ref 15–41)
Anion gap: 4 — ABNORMAL LOW (ref 5–15)
BUN: 24 mg/dL — AB (ref 6–20)
CHLORIDE: 103 mmol/L (ref 101–111)
CO2: 25 mmol/L (ref 22–32)
CREATININE: 0.78 mg/dL (ref 0.61–1.24)
Calcium: 8.7 mg/dL — ABNORMAL LOW (ref 8.9–10.3)
GFR calc Af Amer: >60 mL/min (ref >60)
GFR calc non Af Amer: >60 mL/min (ref >60)
Glucose, Bld: 114 mg/dL — ABNORMAL HIGH (ref 65–99)
Potassium: 4.1 mmol/L (ref 3.5–5.1)
SODIUM: 132 mmol/L — AB (ref 135–145)
Total Bilirubin: 0.6 mg/dL (ref 0.3–1.2)
Total Protein: 6.1 g/dL — ABNORMAL LOW (ref 6.5–8.1)

## 2017-02-11 MED ORDER — DOCETAXEL CHEMO INJECTION 160 MG/16ML
75.0000 mg/m2 | Freq: Once | INTRAVENOUS | Status: AC
  Administered 2017-02-11: 140 mg via INTRAVENOUS
  Filled 2017-02-11: qty 14

## 2017-02-11 MED ORDER — DEXAMETHASONE SODIUM PHOSPHATE 10 MG/ML IJ SOLN
10.0000 mg | Freq: Once | INTRAMUSCULAR | Status: AC
  Administered 2017-02-11: 10 mg via INTRAVENOUS
  Filled 2017-02-11: qty 1

## 2017-02-11 MED ORDER — SODIUM CHLORIDE 0.9 % IV SOLN
Freq: Once | INTRAVENOUS | Status: AC
  Administered 2017-02-11: 12:00:00 via INTRAVENOUS
  Filled 2017-02-11: qty 1000

## 2017-02-11 MED ORDER — HEPARIN SOD (PORK) LOCK FLUSH 100 UNIT/ML IV SOLN
500.0000 [IU] | Freq: Once | INTRAVENOUS | Status: AC | PRN
  Administered 2017-02-11: 500 [IU]
  Filled 2017-02-11: qty 5

## 2017-02-11 MED ORDER — PEGFILGRASTIM 6 MG/0.6ML ~~LOC~~ PSKT
6.0000 mg | PREFILLED_SYRINGE | Freq: Once | SUBCUTANEOUS | Status: AC
  Administered 2017-02-11: 6 mg via SUBCUTANEOUS
  Filled 2017-02-11: qty 0.6

## 2017-02-11 NOTE — Progress Notes (Signed)
Patient here for follow up.Patient states is throat lesions have resolved.

## 2017-02-11 NOTE — Progress Notes (Signed)
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 Hollice Espy for elevated PSA. PSA on 12/03/2016 was 362.9 and patient was also found to have enlarged hard nodular prostate involving the left half of the gland.patient received his first dose of Firmagon injection on 12/07/2016. He also underwent prostate biopsy which showedprosthetic adenocarcinoma in 4 out of 6 cores submitted. Gleason score of 7.  CT abdomen pelvis from 12/11/2016 showed diffuse sclerotic osseous metastases throughout the actual and appendicular skeleton. Subcentimeter low attenuation lesions in the kidney and liver too small to characterized but favored to represent cysts.bone scan showed widespread bony metastatic disease involving bilateral scapula, cervical/thoracic/lumbar spine, pelvic bones, proximal femurs and ribs and likely the calvarium.Nearly super scan appearance.patient reports that he has lost about 20 pounds of weightover the last few months.   He lives with his wife and extended family at home and is independent of his ADLs and IADLs.he reports chronic low back pain but denies other complaints  CT thorax showed no lung or LN mets.   Risks and benefits of both abiraterone and docetaxel were discussed with the patient and he chose to proceed with docetaxel. Cycle 1 on 01/21/17   Interval history- reports mild fatigue. Oral thrush has resolved. Denies other complaints  ECOG PS- 0-1 Pain scale- 0 Opioid associated constipation-  no  Review of systems- Review of Systems  Constitutional: Positive for malaise/fatigue. Negative for chills, fever and weight loss.  HENT: Negative for congestion, ear discharge and nosebleeds.   Eyes: Negative for blurred vision.  Respiratory: Negative for cough, hemoptysis, sputum production, shortness of breath and wheezing.   Cardiovascular: Negative for chest pain, palpitations, orthopnea and claudication.  Gastrointestinal: Negative for abdominal pain, blood in stool, constipation, diarrhea, heartburn, melena, nausea and vomiting.  Genitourinary: Negative for dysuria, flank pain, frequency, hematuria and urgency.  Musculoskeletal: Negative for back pain, joint pain and myalgias.  Skin: Negative for rash.  Neurological: Negative for dizziness, tingling, focal weakness, seizures, weakness and headaches.  Endo/Heme/Allergies: Does not bruise/bleed easily.  Psychiatric/Behavioral: Negative for depression and suicidal ideas. The patient does not have insomnia.       No Known Allergies   Past Medical History:  Diagnosis Date  . Arthritis   . Hepatitis C   . Hyperlipidemia   . Hypertension   . Prostate cancer ([place]) 12/2016  . Stroke [facility])    w left sided weakness per pt (2017)     Past Surgical History:  Procedure Laterality Date  . PORTA CATH INSERTION N/A 01/12/2017   Procedure: Glori Luis Cath Insertion;  Surgeon: Katha Cabal, MD;  Location: [place] CV LAB;  Service: Cardiovascular;  Laterality: N/A;    Social History   Social History  . Marital status: Married    Spouse name: N/A  . Number of children: N/A  . Years of education: N/A   Occupational History  . Not on file.   Social History Main Topics  . Smoking status: Former Smoker    Packs/day: 0.25    Years: 20.00  Types: Cigarettes    Quit date: 01/07/1997  . Smokeless tobacco: Never Used  . Alcohol use No  . Drug use: No  . Sexual activity: No   Other Topics Concern  . Not on file   Social  History Narrative  . No narrative on file    Family History  Problem Relation Age of Onset  . Cancer Sister   . Prostatitis Brother   . Prostatitis Brother   . Prostate cancer Neg Hx   . Bladder Cancer Neg Hx   . Kidney cancer Neg Hx      Current Outpatient Prescriptions:  .  aspirin EC 81 MG tablet, Take 81 mg by mouth., Disp: , Rfl:  .  atorvastatin (LIPITOR) 40 MG tablet, Take 40 mg by mouth at bedtime. , Disp: , Rfl:  .  cyclobenzaprine (FLEXERIL) 10 MG tablet, TAKE ONE TABLET BY MOUTH AT NIGHT AS NEEDED FOR MUSCLE SPASMS, Disp: , Rfl:  .  dexamethasone (DECADRON) 4 MG tablet, Take 2 tablets (8 mg total) by mouth 2 (two) times daily. Start the day before Taxotere. Then daily after chemo for 2 days., Disp: 30 tablet, Rfl: 1 .  fluconazole (DIFLUCAN) 100 MG tablet, Take 1 tablet (100 mg total) by mouth as directed. Take 2 tablets on first day and then 1 tablet a day for 7 more days, Disp: 9 tablet, Rfl: 0 .  gabapentin (NEURONTIN) 300 MG capsule, Take 300 mg by mouth 2 (two) times daily. , Disp: , Rfl:  .  HYDROcodone-acetaminophen (NORCO) 7.5-325 MG tablet, Take 1 tablet by mouth every 6 (six) hours as needed for moderate pain. , Disp: , Rfl:  .  lidocaine-prilocaine (EMLA) cream, Apply to affected area once, Disp: 30 g, Rfl: 3 .  lisinopril (PRINIVIL,ZESTRIL) 5 MG tablet, Take 5 mg by mouth daily. , Disp: , Rfl:  .  LORazepam (ATIVAN) 0.5 MG tablet, Take 1 tablet (0.5 mg total) by mouth every 6 (six) hours as needed (Nausea or vomiting)., Disp: 30 tablet, Rfl: 0 .  mirtazapine (REMERON) 15 MG tablet, Take 15 mg by mouth at bedtime. , Disp: , Rfl:  .  ondansetron (ZOFRAN) 8 MG tablet, Take 1 tablet (8 mg total) by mouth 2 (two) times daily as needed for refractory nausea / vomiting., Disp: 30 tablet, Rfl: 1 .  prochlorperazine (COMPAZINE) 10 MG tablet, Take 1 tablet (10 mg total) by mouth every 6 (six) hours as needed (Nausea or vomiting)., Disp: 30 tablet, Rfl: 1  Physical exam:    Vitals:   02/11/17 1119  BP: (!) 158/92  Pulse: 82  Temp: 98 F (36.7 C)  TempSrc: Tympanic  Weight: 156 lb 4.8 oz (70.9 kg)  Height: 5' 11.5" (1.816 m)   Physical Exam  Constitutional: He is oriented to person, place, and time and well-developed, well-nourished, and in no distress.  HENT:  Head: Normocephalic and atraumatic.  Eyes: EOM are normal. Pupils are equal, round, and reactive to light.  Neck: Normal range of motion.  Cardiovascular: Normal rate, regular rhythm and normal heart sounds.   Pulmonary/Chest: Effort normal and breath sounds normal.  Abdominal: Soft. Bowel sounds are normal.  Neurological: He is alert and oriented to person, place, and time.  Skin: Skin is warm and dry.     CMP Latest Ref Rng & Units 01/28/2017  Glucose 65 - 99 mg/dL 119(H)  BUN 6 - 20 mg/dL 30(H)  Creatinine 0.61 - 1.24 mg/dL 0.97  Sodium 135 - 145 mmol/L 134(L)  Potassium  3.5 - 5.1 mmol/L 4.5  Chloride 101 - 111 mmol/L 102  CO2 22 - 32 mmol/L 25  Calcium 8.9 - 10.3 mg/dL 8.9  Total Protein 6.5 - 8.1 g/dL 6.7  Total Bilirubin 0.3 - 1.2 mg/dL 0.6  Alkaline Phos 38 - 126 U/L 343(H)  AST 15 - 41 U/L 33  ALT 17 - 63 U/L 24   CBC Latest Ref Rng & Units 01/28/2017  WBC 3.8 - 10.6 K/uL 26.2(H)  Hemoglobin 13.0 - 18.0 g/dL 11.1(L)  Hematocrit 40.0 - 52.0 % 33.2(L)  Platelets 150 - 440 K/uL 262    No images are attached to the encounter.  Ct Chest W Contrast  Result Date: 01/13/2017 CLINICAL DATA:  Prostate cancer. EXAM: CT CHEST WITH CONTRAST TECHNIQUE: Multidetector CT imaging of the chest was performed during intravenous contrast administration. CONTRAST:  32mL ISOVUE-300 IOPAMIDOL (ISOVUE-300) INJECTION 61% COMPARISON:  Bone scan 12/11/2016 and CT abdomen pelvis 12/11/2016. FINDINGS: Cardiovascular: There is subcutaneous air in the right infraclavicular region related to recent placement of a right IJ Port-A-Cath which terminates at the SVC RA junction. Atherosclerotic calcification of  the arterial vasculature, including coronary arteries. Heart size normal. No pericardial effusion. Mediastinum/Nodes: Mediastinal and hilar lymph nodes are not enlarged by CT size criteria. No axillary adenopathy. Esophagus is grossly unremarkable. Lungs/Pleura: Minimal biapical pleuroparenchymal scarring. Lungs are clear. No pleural fluid. Airway is unremarkable. Upper Abdomen: Visualized portions of the liver, gallbladder, adrenal glands, kidneys, spleen, pancreas, stomach and bowel are grossly unremarkable. No upper abdominal adenopathy. Musculoskeletal: Mottled sclerosis is seen throughout the visualized osseous structures. IMPRESSION: 1. No pulmonary parenchymal metastatic disease. Diffuse osseous metastatic disease. 2. Aortic atherosclerosis (ICD10-170.0). Coronary artery calcification. Electronically Signed   By: Lorin Picket M.D.   On: 01/13/2017 12:19     Assessment and plan- Patient is a 71 y.o. male with high risk Stage IV castrate sensitive prostate cancer with extensive bone mets here for assessment prior to cycle 2 of docetaxel  Counts ok to proceed with cycle 2 of docetaxel with neulasta support. firmagon through urology. See him back in 3 weeks with blood work  Oral thrush- resolved  Chemo induced fatigue- encourage physical activity   Visit Diagnosis 1. Prostate cancer metastatic to bone ([place])   2. Encounter for antineoplastic chemotherapy      Dr. Randa Evens, MD, MPH [facility] at [facility] Pager- [phone] 02/11/2017 12:23 PM

## 2017-02-15 ENCOUNTER — Ambulatory Visit (INDEPENDENT_AMBULATORY_CARE_PROVIDER_SITE_OTHER): Payer: Medicare Other | Admitting: Urology

## 2017-02-15 ENCOUNTER — Encounter: Payer: Self-pay | Admitting: Urology

## 2017-02-15 ENCOUNTER — Ambulatory Visit
Admission: RE | Admit: 2017-02-15 | Discharge: 2017-02-15 | Disposition: A | Discharge status: Home / Self Care | Payer: Medicare Other | Source: Ambulatory Visit | Attending: Oncology | Admitting: Oncology

## 2017-02-15 VITALS — BP 159/78 | HR 108 | Ht 71.0 in | Wt 158.0 lb

## 2017-02-15 DIAGNOSIS — C7951 Secondary malignant neoplasm of bone: Secondary | ICD-10-CM | POA: Diagnosis not present

## 2017-02-15 DIAGNOSIS — C61 Malignant neoplasm of prostate: Secondary | ICD-10-CM

## 2017-02-15 DIAGNOSIS — M8589 Other specified disorders of bone density and structure, multiple sites: Secondary | ICD-10-CM | POA: Insufficient documentation

## 2017-02-15 DIAGNOSIS — R350 Frequency of micturition: Secondary | ICD-10-CM | POA: Diagnosis not present

## 2017-02-15 MED ORDER — LEUPROLIDE ACETATE (6 MONTH) 45 MG IM KIT
45.0000 mg | PACK | Freq: Once | INTRAMUSCULAR | Status: AC
  Administered 2017-02-15: 45 mg via INTRAMUSCULAR

## 2017-02-15 NOTE — Progress Notes (Signed)
Lupron IM Injection   Due to Prostate Cancer patient is present today for a Lupron Injection.  Medication: Lupron 6 month Dose: 45 mg  Location: left upper outer buttocks Lot: [id number] Exp: 06-07-18  Patient tolerated well, no complications were noted  Performed by: Fonnie Jarvis, CMA  Follow up: 65month lupron

## 2017-02-15 NOTE — Progress Notes (Signed)
02/15/2017 5:29 PM   Francisco Lowe 23-Jul-1946 [id number]  Referring provider: Dion Body, MD [facility] [place], [place] [address]  Chief Complaint  Patient presents with  . Prostate Cancer    11month   HPI: 71yo M With advanced metastatic prostate cancer who returns today primarily for Lupron injection. He's been recently seen and evaluated by Dr. RJanese Banksat the cancer center and is now undergoing treatment with docetaxel in addition to ADT.  At the time of diagnosis, his PSA 12/03/2016  was 362.9 and he was found to have a rock hard nodular prostate involving the entire left half of the gland.  CT abdomen and pelvis with contrast as well as bone scan showed extensive bony metastatic disease, near super scan but no visceral metastases and no significant adenopathy. He did have a confirmatory prostate biopsy on 12/16/2016 with Gleason 4+3 prostate cancer, positive perineural invasion.  He reports that he has been voiding fairly well but continues to have urinary urgency, frequency, and nocturia. Over the past few months. He does feel like he has an adequate urinary stream and is able to empty.  He has been having hot flashes. Overall, he has some joint aches and is feeling fatigued from his chemotherapy.   PMH: Past Medical History:  Diagnosis Date  . Arthritis   . Hepatitis C   . Hyperlipidemia   . Hypertension   . Prostate cancer ([place] 12/2016  . Stroke ([facility]    w left sided weakness per pt (2017)    Surgical History: Past Surgical History:  Procedure Laterality Date  . PORTA CATH INSERTION N/A 01/12/2017   Procedure: PGlori LuisCath Insertion;  Surgeon: GKatha Cabal MD;  [facility];  Service: Cardiovascular;  Laterality: N/A;    Home Medications:  Allergies as of 02/15/2017   No Known Allergies     Medication List       Accurate as of 02/15/17  5:29 PM. Always use your most recent med list.            aspirin EC 81 MG tablet Take 81 mg by mouth.   atorvastatin 40 MG tablet Commonly known as:  LIPITOR Take 40 mg by mouth at bedtime.   cyclobenzaprine 10 MG tablet Commonly known as:  FLEXERIL TAKE ONE TABLET BY MOUTH AT NIGHT AS NEEDED FOR MUSCLE SPASMS   dexamethasone 4 MG tablet Commonly known as:  DECADRON Take 2 tablets (8 mg total) by mouth 2 (two) times daily. Start the day before Taxotere. Then daily after chemo for 2 days.   fluconazole 100 MG tablet Commonly known as:  DIFLUCAN Take 1 tablet (100 mg total) by mouth as directed. Take 2 tablets on first day and then 1 tablet a day for 7 more days   gabapentin 300 MG capsule Commonly known as:  NEURONTIN Take 300 mg by mouth 2 (two) times daily.   HYDROcodone-acetaminophen 7.5-325 MG tablet Commonly known as:  NORCO Take 1 tablet by mouth every 6 (six) hours as needed for moderate pain.   lidocaine-prilocaine cream Commonly known as:  EMLA Apply to affected area once   lisinopril 5 MG tablet Commonly known as:  PRINIVIL,ZESTRIL Take 5 mg by mouth daily.   LORazepam 0.5 MG tablet Commonly known as:  ATIVAN Take 1 tablet (0.5 mg total) by mouth every 6 (six) hours as needed (Nausea or vomiting).   LUPRON DEPOT (89-MONTH) 45 MG injection Generic drug:  Leuprolide  Acetate (6 Month) Inject 45 mg into the muscle every 6 (six) months.   mirtazapine 15 MG tablet Commonly known as:  REMERON Take 15 mg by mouth at bedtime.   ondansetron 8 MG tablet Commonly known as:  ZOFRAN Take 1 tablet (8 mg total) by mouth 2 (two) times daily as needed for refractory nausea / vomiting.   prochlorperazine 10 MG tablet Commonly known as:  COMPAZINE Take 1 tablet (10 mg total) by mouth every 6 (six) hours as needed (Nausea or vomiting).       Allergies: No Known Allergies  Family History: Family History  Problem Relation Age of Onset  . Cancer Sister   . Prostatitis Brother   . Prostatitis Brother   . Prostate cancer  Neg Hx   . Bladder Cancer Neg Hx   . Kidney cancer Neg Hx     Social History:  reports that he quit smoking about 20 years ago. His smoking use included Cigarettes. He has a 5.00 pack-year smoking history. He has never used smokeless tobacco. He reports that he does not drink alcohol or use drugs.   Physical Exam: BP (!) 159/78   Pulse (!) 108   Ht _0  (1.803 m)   Wt 158 lb (71.7 kg)   BMI 22.04 kg/m   Constitutional:  Alert and oriented, No acute distress. HEENT: [place] AT, moist mucus membranes.  Trachea midline, no masses. Cardiovascular: No clubbing, cyanosis, or edema. Respiratory: Normal respiratory effort, no increased work of breathing. GI: Abdomen is soft, nontender, nondistended, no abdominal masses GU: No CVA tenderness.  Skin: No rashes, bruises or suspicious lesions. Neurologic: Grossly intact, no focal deficits, moving all 4 extremities. Psychiatric: Normal mood and affect.  Laboratory Data: 12/01/16 Cr 0.9 PSA 369 AST 42/ ALT 12 Alk phos 414  Lab Results  Component Value Date   PSA 25.02 (H) 01/07/2017    Pertinent Imaging: No new interval imaging  Assessment & Plan:    1. Prostate cancer [facility]) 71 year old male Stage IV castrate sensitive prostate cancer, extensive bony metastatic disease managed with ADT/docetaxel.  Case discussed with Dr. Janese Banks today, we will continue to give him his Lupron here at [facility]. Six-month dental was given. Reviewed side effects, all questions were answered.  2.Urinary frequency Suspect this will improve with response to chemotherapy, patient advised to return sooner if his urinary symptoms do not continue to improve    Return in about 6 months (around 08/18/2017) for Lupron.  Hollice Espy, MD  [facility] [address] [place], [place] [address] [phone]

## 2017-03-03 NOTE — Progress Notes (Addendum)
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 Hollice Espy for elevated PSA. PSA on 12/03/2016 was 362.9 and patient was also found to have enlarged hard nodular prostate involving the left half of the gland.patient received his first dose of Firmagon injection on 12/07/2016. He also underwent prostate biopsy which showedprosthetic adenocarcinoma in 4 out of 6 cores submitted. Gleason score of 7.  CT abdomen pelvis from 12/11/2016 showed diffuse sclerotic osseous metastases throughout the actual and appendicular skeleton. Subcentimeter low attenuation lesions in the kidney and liver too small to characterized but favored to represent cysts.bone scan showed widespread bony metastatic disease involving bilateral scapula, cervical/thoracic/lumbar spine, pelvic bones, proximal femurs and ribs and likely the calvarium.Nearly super scan appearance.patient reports that he has lost about 20 pounds of weightover the last few months.   He lives with his wife and extended family at home and is independent of his ADLs and IADLs.he reports chronic low back pain but denies other complaints  CT thorax showed no lung or LN mets.   Risks and benefits of both abiraterone and docetaxel were discussed with the patient and he chose to proceed with docetaxel. Cycle 1 on 01/21/17  He gets ADT through urology   Interval history- reports leg swelling since last couple of weeks. Also feels that his soles are sensitive when he walks.  Denies any burning pain or numbness  ECOG PS- 1 Pain scale- 0 Opioid associated constipation- no  Review of systems- Review of Systems  Constitutional: Negative for chills, fever, malaise/fatigue and weight loss.  HENT: Negative for congestion, ear discharge and nosebleeds.   Eyes: Negative for blurred vision.  Respiratory: Negative for cough, hemoptysis, sputum production, shortness of breath and wheezing.   Cardiovascular: Positive for leg swelling. Negative for chest pain, palpitations, orthopnea and claudication.  Gastrointestinal: Negative for abdominal pain, blood in stool, constipation, diarrhea, heartburn, melena, nausea and vomiting.  Genitourinary: Negative for dysuria, flank pain, frequency, hematuria and urgency.  Musculoskeletal: Negative for back pain, joint pain and myalgias.  Skin: Negative for rash.  Neurological: Negative for dizziness, tingling, focal weakness, seizures, weakness and headaches.       Sensitivity in b/l feet  Endo/Heme/Allergies: Does not bruise/bleed easily.  Psychiatric/Behavioral: Negative for depression and suicidal ideas. The patient does not have insomnia.       No Known Allergies   Past Medical History:  Diagnosis Date  . Arthritis   . Hepatitis C   . Hyperlipidemia   . Hypertension   . Prostate cancer ([place]) 12/2016  . Stroke [facility])    w left sided weakness per pt (2017)     Past Surgical History:  Procedure Laterality Date  . PORTA CATH INSERTION N/A 01/12/2017   Procedure: Glori Luis Cath Insertion;  Surgeon: Katha Cabal, MD;  Location: [facility];  Service: Cardiovascular;  Laterality: N/A;    Social History   Social History  . Marital status: Married    Spouse name: N/A  . Number of children: N/A  . Years of education: N/A  Occupational History  . Not on file.   Social History Main Topics  . Smoking status: Former Smoker    Packs/day: 0.25    Years: 20.00    Types: Cigarettes    Quit date: 01/07/1997  .  Smokeless tobacco: Never Used  . Alcohol use No  . Drug use: No  . Sexual activity: No   Other Topics Concern  . Not on file   Social History Narrative  . No narrative on file    Family History  Problem Relation Age of Onset  . Cancer Sister   . Prostatitis Brother   . Prostatitis Brother   . Prostate cancer Neg Hx   . Bladder Cancer Neg Hx   . Kidney cancer Neg Hx      Current Outpatient Prescriptions:  .  aspirin EC 81 MG tablet, Take 81 mg by mouth., Disp: , Rfl:  .  atorvastatin (LIPITOR) 40 MG tablet, Take 40 mg by mouth at bedtime. , Disp: , Rfl:  .  cyclobenzaprine (FLEXERIL) 10 MG tablet, TAKE ONE TABLET BY MOUTH AT NIGHT AS NEEDED FOR MUSCLE SPASMS, Disp: , Rfl:  .  gabapentin (NEURONTIN) 300 MG capsule, Take 300 mg by mouth 2 (two) times daily. , Disp: , Rfl:  .  HYDROcodone-acetaminophen (NORCO) 7.5-325 MG tablet, Take 1 tablet by mouth every 6 (six) hours as needed for moderate pain. , Disp: , Rfl:  .  lisinopril (PRINIVIL,ZESTRIL) 5 MG tablet, Take 5 mg by mouth daily. , Disp: , Rfl:  .  mirtazapine (REMERON) 15 MG tablet, Take 15 mg by mouth at bedtime. , Disp: , Rfl:  .  ondansetron (ZOFRAN) 8 MG tablet, Take 1 tablet (8 mg total) by mouth 2 (two) times daily as needed for refractory nausea / vomiting., Disp: 30 tablet, Rfl: 1 .  prochlorperazine (COMPAZINE) 10 MG tablet, Take 1 tablet (10 mg total) by mouth every 6 (six) hours as needed (Nausea or vomiting)., Disp: 30 tablet, Rfl: 1 .  dexamethasone (DECADRON) 4 MG tablet, Take 2 tablets (8 mg total) by mouth 2 (two) times daily. Start the day before Taxotere. Then daily after chemo for 2 days. (Patient not taking: Reported on 03/04/2017), Disp: 30 tablet, Rfl: 1 .  fluconazole (DIFLUCAN) 100 MG tablet, Take 1 tablet (100 mg total) by mouth as directed. Take 2 tablets on first day and then 1 tablet a day for 7 more days (Patient not taking: Reported on 03/04/2017), Disp: 9 tablet, Rfl: 0 .  Leuprolide Acetate, 6  Month, (LUPRON DEPOT, 81-MONTH,) 45 MG injection, Inject 45 mg into the muscle every 6 (six) months., Disp: , Rfl:  .  lidocaine-prilocaine (EMLA) cream, Apply to affected area once (Patient not taking: Reported on 03/04/2017), Disp: 30 g, Rfl: 3 .  LORazepam (ATIVAN) 0.5 MG tablet, Take 1 tablet (0.5 mg total) by mouth every 6 (six) hours as needed (Nausea or vomiting). (Patient not taking: Reported on 03/04/2017), Disp: 30 tablet, Rfl: 0 .  sertraline (ZOLOFT) 25 MG tablet, Take 25 mg by mouth., Disp: , Rfl:  No current facility-administered medications for this visit.   Facility-Administered Medications Ordered in Other Visits:  .  heparin lock flush 100 unit/mL, 500 Units, Intravenous, Once, Sindy Guadeloupe, MD  Physical exam:  Vitals:   03/04/17 1003  BP: (!) 151/85  Pulse: 84  Resp: 18  Temp: (!) 96.4 F (35.8 C)  TempSrc: Tympanic  Weight: 157 lb 12.8 oz (71.6 kg)   Physical Exam  Constitutional: He  is oriented to person, place, and time and well-developed, well-nourished, and in no distress.  HENT:  Head: Normocephalic and atraumatic.  Eyes: EOM are normal. Pupils are equal, round, and reactive to light.  Neck: Normal range of motion.  Cardiovascular: Normal rate, regular rhythm and normal heart sounds.   Pulmonary/Chest: Effort normal and breath sounds normal.  Abdominal: Soft. Bowel sounds are normal.  Musculoskeletal: He exhibits edema.  Neurological: He is alert and oriented to person, place, and time.  Skin: Skin is warm and dry.     CMP Latest Ref Rng & Units 03/04/2017  Glucose 65 - 99 mg/dL 145(H)  BUN 6 - 20 mg/dL 10  Creatinine 0.61 - 1.24 mg/dL 0.70  Sodium 135 - 145 mmol/L 141  Potassium 3.5 - 5.1 mmol/L 4.0  Chloride 101 - 111 mmol/L 110  CO2 22 - 32 mmol/L 25  Calcium 8.9 - 10.3 mg/dL 9.0  Total Protein 6.5 - 8.1 g/dL 6.8  Total Bilirubin 0.3 - 1.2 mg/dL 0.6  Alkaline Phos 38 - 126 U/L 108  AST 15 - 41 U/L 36  ALT 17 - 63 U/L 23   CBC Latest Ref Rng &  Units 03/04/2017  WBC 3.8 - 10.6 K/uL 7.3  Hemoglobin 13.0 - 18.0 g/dL 10.8(L)  Hematocrit 40.0 - 52.0 % 31.8(L)  Platelets 150 - 440 K/uL 390    No images are attached to the encounter.  Dg Bone Density  Result Date: 02/15/2017 EXAM: DUAL X-RAY ABSORPTIOMETRY (DXA) FOR BONE MINERAL DENSITY IMPRESSION: Dear Dr. Sindy Guadeloupe, Your patient EMANI MORAD completed a FRAX assessment on 02/15/2017 using the [place] (analysis version: 14.10) manufactured by [employer]. The following summarizes the results of our evaluation. PATIENT BIOGRAPHICAL: Name: Newell, Wafer Patient ID: [id number] Birth Date: 12-Dec-1945 Height:    69.0 in. Gender:     Male      [age]       Weight:    158.5 lbs. Ethnicity:  Black                            Exam Date: 02/15/2017 FRAX* RESULTS:  (version: 3.5) 10-year Probability of Fracture1 Major Osteoporotic Fracture2 Hip Fracture 4.6% 1.2% Population: [place] (Black) Risk Factors: History of Fracture (Adult) Based on Femur (Right) Neck BMD 1 -The 10-year probability of fracture may be lower than reported if the patient has received treatment. 2 -Major Osteoporotic Fracture: Clinical Spine, Forearm, Hip or Shoulder *FRAX is a Materials engineer of the [employer] for Metabolic Bone Disease, a [place] (WHO) [facility]. ASSESSMENT: The probability of a major osteoporotic fracture is 4.6% within the next ten years. The probability of a hip fracture is 1.2% within the next ten years. . Dear Dr. Sindy Guadeloupe, Your patient Megan Hayduk completed a BMD test on 02/15/2017 using the [place] (analysis version: 14.10) manufactured by [employer]. The following summarizes the results of our evaluation. PATIENT BIOGRAPHICAL: Name: Randol, Zumstein Patient ID: [id number] Birth Date: 09-14-1946 Height: 69.0 in. Gender: Male Exam Date: 02/15/2017 Weight: 158.5 lbs. Indications: Advanced Age, Height Loss,  History of Fracture (Adult), Prostate Cancer Fractures: Tibia/Fibula Treatments: ASSESSMENT: The BMD measured at Femur Neck Right is 0.833 g/cm2 with a T-score of -1.8. This patient is considered osteopenic according to [facility]) criteria. Lumbar spine was not utilized due to  advanced degenerative changes. Site Region Measured Measured WHO Young Adult BMD Date       Age      Classification T-score DualFemur Neck Right 02/15/2017 71.8 Osteopenia -1.8 0.833 g/cm2 Left Forearm Radius 33% 02/15/2017 71.8 Osteopenia -1.1 0.882 g/cm2 World Health Organization [facility]) criteria for post-menopausal, Caucasian Women: Normal:       T-score at or above -1 SD Osteopenia:   T-score between -1 and -2.5 SD Osteoporosis: T-score at or below -2.5 SD RECOMMENDATIONS: [place] recommends that FDA-approved medical therapies be considered in postmenopausal women and men age 37 or older with a: 1. Hip or vertebral (clinical or morphometric) fracture. 2. T-score of < -2.5 at the spine or hip. 3. Ten-year fracture probability by FRAX of 3% or greater for hip fracture or 20% or greater for major osteoporotic fracture. All treatment decisions require clinical judgment and consideration of individual patient factors, including patient preferences, co-morbidities, previous drug use, risk factors not captured in the FRAX model (e.g. falls, vitamin D deficiency, increased bone turnover, interval significant decline in bone density) and possible under - or over-estimation of fracture risk by FRAX. All patients should ensure an adequate intake of dietary calcium (1200 mg/d) and vitamin D (800 IU daily) unless contraindicated. FOLLOW-UP: People with diagnosed cases of osteoporosis or osteopenia should be regularly tested for bone mineral density. For patients eligible for Medicare, routine testing is allowed once every 2 years. The testing frequency can be increased to one year for patients who have  rapidly progressing disease, or for those who are receiving medical therapy to restore bone mass. I have reviewed this report, and agree with the above findings. [facility] Electronically Signed   By: Rolm Baptise M.D.   On: 02/15/2017 11:21     Assessment and plan- Patient is a 71 y.o. male with castrate sensitive high risk prostate cancer currently receiving cycle 3 of docetaxel today  Counts are ok to proceed with cycle 3 of docetaxel today with neulasta support. He has been getting ADT through Dr. Erlene Quan. Clinically he is responding to treatment. His alkaline phosphatase is coming down. PSA from today is pending. I will see him back in 3 weeks prior to cycle 4 of docetaxel with labs- cbc, cmp and PSA  Chemo induced peripheral neuropathy and edema- decrease docetaxel to 60 mg/meter square. Patient takes neurontin 300 mg qhs. I have advised him to increase it to TID. Keep legs elevated when possible.   Chemo induced anemia- continue to monitor. Add iron studies, b12 and folate with next set of labs   Visit Diagnosis 1. Prostate cancer metastatic to bone ([place])   2. Encounter for antineoplastic chemotherapy   3. Chemotherapy-induced neuropathy ([place])   4. Anemia due to antineoplastic chemotherapy      Dr. Randa Evens, MD, MPH [facility] Pager- [phone] 03/04/2017 10:20 AM

## 2017-03-04 ENCOUNTER — Inpatient Hospital Stay (HOSPITAL_BASED_OUTPATIENT_CLINIC_OR_DEPARTMENT_OTHER): Payer: Medicare Other | Admitting: Oncology

## 2017-03-04 ENCOUNTER — Inpatient Hospital Stay: Payer: Medicare Other

## 2017-03-04 ENCOUNTER — Encounter: Payer: Self-pay | Admitting: Oncology

## 2017-03-04 ENCOUNTER — Other Ambulatory Visit: Payer: Self-pay | Admitting: Oncology

## 2017-03-04 VITALS — BP 151/85 | HR 84 | Temp 96.4°F | Resp 18 | Wt 157.8 lb

## 2017-03-04 DIAGNOSIS — M545 Low back pain: Secondary | ICD-10-CM

## 2017-03-04 DIAGNOSIS — C61 Malignant neoplasm of prostate: Secondary | ICD-10-CM

## 2017-03-04 DIAGNOSIS — R600 Localized edema: Secondary | ICD-10-CM

## 2017-03-04 DIAGNOSIS — G8929 Other chronic pain: Secondary | ICD-10-CM

## 2017-03-04 DIAGNOSIS — D6481 Anemia due to antineoplastic chemotherapy: Secondary | ICD-10-CM

## 2017-03-04 DIAGNOSIS — T451X5S Adverse effect of antineoplastic and immunosuppressive drugs, sequela: Secondary | ICD-10-CM

## 2017-03-04 DIAGNOSIS — Z5111 Encounter for antineoplastic chemotherapy: Secondary | ICD-10-CM

## 2017-03-04 DIAGNOSIS — I10 Essential (primary) hypertension: Secondary | ICD-10-CM | POA: Diagnosis not present

## 2017-03-04 DIAGNOSIS — Z8673 Personal history of transient ischemic attack (TIA), and cerebral infarction without residual deficits: Secondary | ICD-10-CM | POA: Diagnosis not present

## 2017-03-04 DIAGNOSIS — C7951 Secondary malignant neoplasm of bone: Principal | ICD-10-CM

## 2017-03-04 DIAGNOSIS — I69354 Hemiplegia and hemiparesis following cerebral infarction affecting left non-dominant side: Secondary | ICD-10-CM

## 2017-03-04 DIAGNOSIS — G620 Drug-induced polyneuropathy: Secondary | ICD-10-CM

## 2017-03-04 DIAGNOSIS — Z809 Family history of malignant neoplasm, unspecified: Secondary | ICD-10-CM

## 2017-03-04 DIAGNOSIS — Z7982 Long term (current) use of aspirin: Secondary | ICD-10-CM

## 2017-03-04 DIAGNOSIS — Z8619 Personal history of other infectious and parasitic diseases: Secondary | ICD-10-CM

## 2017-03-04 DIAGNOSIS — Z79899 Other long term (current) drug therapy: Secondary | ICD-10-CM

## 2017-03-04 DIAGNOSIS — Z87891 Personal history of nicotine dependence: Secondary | ICD-10-CM

## 2017-03-04 DIAGNOSIS — T451X5A Adverse effect of antineoplastic and immunosuppressive drugs, initial encounter: Secondary | ICD-10-CM

## 2017-03-04 DIAGNOSIS — E785 Hyperlipidemia, unspecified: Secondary | ICD-10-CM

## 2017-03-04 LAB — CBC WITH DIFFERENTIAL/PLATELET
Basophils Absolute: 0.1 10*3/uL (ref 0–0.1)
Basophils Relative: 1 %
EOS PCT: 0 %
Eosinophils Absolute: 0 10*3/uL (ref 0–0.7)
HCT: 31.8 % — ABNORMAL LOW (ref 40.0–52.0)
Hemoglobin: 10.8 g/dL — ABNORMAL LOW (ref 13.0–18.0)
LYMPHS ABS: 0.7 10*3/uL — AB (ref 1.0–3.6)
Lymphocytes Relative: 9 %
MCH: 30 pg (ref 26.0–34.0)
MCHC: 33.8 g/dL (ref 32.0–36.0)
MCV: 88.6 fL (ref 80.0–100.0)
MONO ABS: 0.2 10*3/uL (ref 0.2–1.0)
Monocytes Relative: 3 %
Neutro Abs: 6.3 10*3/uL (ref 1.4–6.5)
Neutrophils Relative %: 87 %
PLATELETS: 390 10*3/uL (ref 150–440)
RBC: 3.59 MIL/uL — ABNORMAL LOW (ref 4.40–5.90)
RDW: 19.1 % — AB (ref 11.5–14.5)
WBC: 7.3 10*3/uL (ref 3.8–10.6)

## 2017-03-04 LAB — PSA: PSA: 2.93 ng/mL (ref 0.00–4.00)

## 2017-03-04 LAB — COMPREHENSIVE METABOLIC PANEL
ALT: 23 U/L (ref 17–63)
AST: 36 U/L (ref 15–41)
Albumin: 3.2 g/dL — ABNORMAL LOW (ref 3.5–5.0)
Alkaline Phosphatase: 108 U/L (ref 38–126)
Anion gap: 6 (ref 5–15)
BUN: 10 mg/dL (ref 6–20)
CHLORIDE: 110 mmol/L (ref 101–111)
CO2: 25 mmol/L (ref 22–32)
CREATININE: 0.7 mg/dL (ref 0.61–1.24)
Calcium: 9 mg/dL (ref 8.9–10.3)
Glucose, Bld: 145 mg/dL — ABNORMAL HIGH (ref 65–99)
POTASSIUM: 4 mmol/L (ref 3.5–5.1)
Sodium: 141 mmol/L (ref 135–145)
Total Bilirubin: 0.6 mg/dL (ref 0.3–1.2)
Total Protein: 6.8 g/dL (ref 6.5–8.1)

## 2017-03-04 MED ORDER — SODIUM CHLORIDE 0.9 % IV SOLN
Freq: Once | INTRAVENOUS | Status: AC
  Administered 2017-03-04: 10:00:00 via INTRAVENOUS
  Filled 2017-03-04: qty 1000

## 2017-03-04 MED ORDER — SODIUM CHLORIDE 0.9% FLUSH
10.0000 mL | INTRAVENOUS | Status: DC | PRN
  Administered 2017-03-04: 10 mL
  Filled 2017-03-04: qty 10

## 2017-03-04 MED ORDER — PEGFILGRASTIM 6 MG/0.6ML ~~LOC~~ PSKT
6.0000 mg | PREFILLED_SYRINGE | Freq: Once | SUBCUTANEOUS | Status: AC
  Administered 2017-03-04: 6 mg via SUBCUTANEOUS
  Filled 2017-03-04: qty 0.6

## 2017-03-04 MED ORDER — GABAPENTIN 300 MG PO CAPS: 300.0000 mg | ORAL_CAPSULE | Freq: Three times a day (TID) | ORAL | 3 refills | Status: DC

## 2017-03-04 MED ORDER — DOCETAXEL CHEMO INJECTION 160 MG/16ML
60.0000 mg/m2 | Freq: Once | INTRAVENOUS | Status: AC
  Administered 2017-03-04: 110 mg via INTRAVENOUS
  Filled 2017-03-04: qty 11

## 2017-03-04 MED ORDER — DEXAMETHASONE SODIUM PHOSPHATE 10 MG/ML IJ SOLN
10.0000 mg | Freq: Once | INTRAMUSCULAR | Status: AC
  Administered 2017-03-04: 10 mg via INTRAVENOUS
  Filled 2017-03-04: qty 1

## 2017-03-04 MED ORDER — SODIUM CHLORIDE 0.9% FLUSH
10.0000 mL | Freq: Once | INTRAVENOUS | Status: AC
  Administered 2017-03-04: 10 mL via INTRAVENOUS
  Filled 2017-03-04: qty 10

## 2017-03-04 MED ORDER — HEPARIN SOD (PORK) LOCK FLUSH 100 UNIT/ML IV SOLN: 500.0000 [IU] | Freq: Once | INTRAVENOUS | Status: DC

## 2017-03-04 MED ORDER — HEPARIN SOD (PORK) LOCK FLUSH 100 UNIT/ML IV SOLN
500.0000 [IU] | Freq: Once | INTRAVENOUS | Status: AC | PRN
  Administered 2017-03-04: 500 [IU]
  Filled 2017-03-04: qty 5

## 2017-03-04 NOTE — Progress Notes (Signed)
Here for f/u. Stated doing well.

## 2017-03-04 NOTE — Addendum Note (Signed)
Addended by: Luella Cook on: 03/04/2017 10:50 AM   Modules accepted: Orders

## 2017-03-04 NOTE — Addendum Note (Signed)
Addended by: Luella Cook on: 03/04/2017 10:53 AM   Modules accepted: Orders

## 2017-03-25 ENCOUNTER — Inpatient Hospital Stay: Payer: Medicare Other

## 2017-03-25 ENCOUNTER — Inpatient Hospital Stay (HOSPITAL_BASED_OUTPATIENT_CLINIC_OR_DEPARTMENT_OTHER): Payer: Medicare Other | Admitting: Oncology

## 2017-03-25 ENCOUNTER — Encounter: Payer: Self-pay | Admitting: Oncology

## 2017-03-25 ENCOUNTER — Inpatient Hospital Stay: Payer: Medicare Other | Attending: Oncology

## 2017-03-25 VITALS — BP 142/92 | HR 96 | Temp 98.0°F | Resp 18 | Wt 155.4 lb

## 2017-03-25 DIAGNOSIS — I10 Essential (primary) hypertension: Secondary | ICD-10-CM | POA: Diagnosis not present

## 2017-03-25 DIAGNOSIS — R200 Anesthesia of skin: Secondary | ICD-10-CM

## 2017-03-25 DIAGNOSIS — R5383 Other fatigue: Secondary | ICD-10-CM

## 2017-03-25 DIAGNOSIS — G620 Drug-induced polyneuropathy: Secondary | ICD-10-CM | POA: Insufficient documentation

## 2017-03-25 DIAGNOSIS — C7951 Secondary malignant neoplasm of bone: Secondary | ICD-10-CM

## 2017-03-25 DIAGNOSIS — M545 Low back pain: Secondary | ICD-10-CM | POA: Insufficient documentation

## 2017-03-25 DIAGNOSIS — T451X5S Adverse effect of antineoplastic and immunosuppressive drugs, sequela: Secondary | ICD-10-CM | POA: Insufficient documentation

## 2017-03-25 DIAGNOSIS — C61 Malignant neoplasm of prostate: Secondary | ICD-10-CM | POA: Insufficient documentation

## 2017-03-25 DIAGNOSIS — Z8673 Personal history of transient ischemic attack (TIA), and cerebral infarction without residual deficits: Secondary | ICD-10-CM

## 2017-03-25 DIAGNOSIS — R202 Paresthesia of skin: Secondary | ICD-10-CM

## 2017-03-25 DIAGNOSIS — D649 Anemia, unspecified: Secondary | ICD-10-CM | POA: Diagnosis not present

## 2017-03-25 DIAGNOSIS — Z7982 Long term (current) use of aspirin: Secondary | ICD-10-CM | POA: Insufficient documentation

## 2017-03-25 DIAGNOSIS — E785 Hyperlipidemia, unspecified: Secondary | ICD-10-CM

## 2017-03-25 DIAGNOSIS — Z87891 Personal history of nicotine dependence: Secondary | ICD-10-CM | POA: Insufficient documentation

## 2017-03-25 DIAGNOSIS — B1920 Unspecified viral hepatitis C without hepatic coma: Secondary | ICD-10-CM | POA: Diagnosis not present

## 2017-03-25 DIAGNOSIS — R5381 Other malaise: Secondary | ICD-10-CM

## 2017-03-25 DIAGNOSIS — Z79899 Other long term (current) drug therapy: Secondary | ICD-10-CM

## 2017-03-25 DIAGNOSIS — R600 Localized edema: Secondary | ICD-10-CM

## 2017-03-25 DIAGNOSIS — T451X5A Adverse effect of antineoplastic and immunosuppressive drugs, initial encounter: Secondary | ICD-10-CM

## 2017-03-25 DIAGNOSIS — Z5111 Encounter for antineoplastic chemotherapy: Secondary | ICD-10-CM | POA: Diagnosis not present

## 2017-03-25 DIAGNOSIS — D6481 Anemia due to antineoplastic chemotherapy: Secondary | ICD-10-CM

## 2017-03-25 DIAGNOSIS — G8929 Other chronic pain: Secondary | ICD-10-CM | POA: Diagnosis not present

## 2017-03-25 LAB — COMPREHENSIVE METABOLIC PANEL
ALBUMIN: 2.9 g/dL — AB (ref 3.5–5.0)
ALK PHOS: 70 U/L (ref 38–126)
ALT: 24 U/L (ref 17–63)
ANION GAP: 6 (ref 5–15)
AST: 31 U/L (ref 15–41)
BUN: 13 mg/dL (ref 6–20)
CHLORIDE: 108 mmol/L (ref 101–111)
CO2: 23 mmol/L (ref 22–32)
Calcium: 8.4 mg/dL — ABNORMAL LOW (ref 8.9–10.3)
Creatinine, Ser: 0.68 mg/dL (ref 0.61–1.24)
GFR calc non Af Amer: >60 mL/min (ref >60)
GLUCOSE: 110 mg/dL — AB (ref 65–99)
Potassium: 3.5 mmol/L (ref 3.5–5.1)
SODIUM: 137 mmol/L (ref 135–145)
Total Bilirubin: 0.5 mg/dL (ref 0.3–1.2)
Total Protein: 6.3 g/dL — ABNORMAL LOW (ref 6.5–8.1)

## 2017-03-25 LAB — CBC WITH DIFFERENTIAL/PLATELET
BASOS PCT: 1 %
Basophils Absolute: 0.1 10*3/uL (ref 0–0.1)
EOS ABS: 0.1 10*3/uL (ref 0–0.7)
EOS PCT: 2 %
HCT: 31 % — ABNORMAL LOW (ref 40.0–52.0)
HEMOGLOBIN: 10.4 g/dL — AB (ref 13.0–18.0)
LYMPHS ABS: 1.1 10*3/uL (ref 1.0–3.6)
Lymphocytes Relative: 17 %
MCH: 29.4 pg (ref 26.0–34.0)
MCHC: 33.5 g/dL (ref 32.0–36.0)
MCV: 87.7 fL (ref 80.0–100.0)
MONOS PCT: 12 %
Monocytes Absolute: 0.8 10*3/uL (ref 0.2–1.0)
NEUTROS PCT: 68 %
Neutro Abs: 4.3 10*3/uL (ref 1.4–6.5)
PLATELETS: 268 10*3/uL (ref 150–440)
RBC: 3.54 MIL/uL — AB (ref 4.40–5.90)
RDW: 18.9 % — ABNORMAL HIGH (ref 11.5–14.5)
WBC: 6.3 10*3/uL (ref 3.8–10.6)

## 2017-03-25 LAB — PSA: PROSTATIC SPECIFIC ANTIGEN: 1.81 ng/mL (ref 0.00–4.00)

## 2017-03-25 LAB — FERRITIN: Ferritin: 1162 ng/mL — ABNORMAL HIGH (ref 24–336)

## 2017-03-25 LAB — IRON AND TIBC
IRON: 15 ug/dL — AB (ref 45–182)
SATURATION RATIOS: 6 % — AB (ref 17.9–39.5)
TIBC: 244 ug/dL — AB (ref 250–450)
UIBC: 229 ug/dL

## 2017-03-25 MED ORDER — PEGFILGRASTIM 6 MG/0.6ML ~~LOC~~ PSKT
6.0000 mg | PREFILLED_SYRINGE | Freq: Once | SUBCUTANEOUS | Status: AC
Start: 2017-03-25 — End: 2017-03-25
  Administered 2017-03-25: 6 mg via SUBCUTANEOUS
  Filled 2017-03-25: qty 0.6

## 2017-03-25 MED ORDER — DEXAMETHASONE 4 MG PO TABS: ORAL_TABLET | ORAL | 0 refills | Status: DC

## 2017-03-25 MED ORDER — SODIUM CHLORIDE 0.9 % IV SOLN
Freq: Once | INTRAVENOUS | Status: AC
  Administered 2017-03-25: 12:00:00 via INTRAVENOUS
  Filled 2017-03-25: qty 1000

## 2017-03-25 MED ORDER — DULOXETINE HCL 30 MG PO CPEP: 30.0000 mg | ORAL_CAPSULE | ORAL | 0 refills | Status: DC

## 2017-03-25 MED ORDER — HEPARIN SOD (PORK) LOCK FLUSH 100 UNIT/ML IV SOLN
500.0000 [IU] | Freq: Once | INTRAVENOUS | Status: AC | PRN
Start: 2017-03-25 — End: 2017-03-25
  Administered 2017-03-25: 500 [IU]
  Filled 2017-03-25: qty 5

## 2017-03-25 MED ORDER — DOCETAXEL CHEMO INJECTION 160 MG/16ML
60.0000 mg/m2 | Freq: Once | INTRAVENOUS | Status: AC
  Administered 2017-03-25: 110 mg via INTRAVENOUS
  Filled 2017-03-25: qty 11

## 2017-03-25 MED ORDER — DEXAMETHASONE SODIUM PHOSPHATE 10 MG/ML IJ SOLN
10.0000 mg | Freq: Once | INTRAMUSCULAR | Status: AC
  Administered 2017-03-25: 10 mg via INTRAVENOUS

## 2017-03-25 NOTE — Progress Notes (Signed)
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 Hollice Espy for elevated PSA. PSA on 12/03/2016 was 362.9 and patient was also found to have enlarged hard nodular prostate involving the left half of the gland.patient received his first dose of Firmagon injection on 12/07/2016. He also underwent prostate biopsy which showedprosthetic adenocarcinoma in 4 out of 6 cores submitted. Gleason score of 7.  CT abdomen pelvis from 12/11/2016 showed diffuse sclerotic osseous metastases throughout the actual and appendicular skeleton. Subcentimeter low attenuation lesions in the kidney and liver too small to characterized but favored to represent cysts.bone scan showed widespread bony metastatic disease involving bilateral scapula, cervical/thoracic/lumbar spine, pelvic bones, proximal femurs and ribs and likely the calvarium.Nearly super scan appearance.patient reports that he has lost about 20 pounds of weightover the last few months.   He lives with his wife and extended family at home and is independent of his ADLs and IADLs.he reports chronic low back pain but denies other complaints  CT thorax showed no lung or LN mets.   Risks and benefits of both abiraterone and docetaxel were discussed with the patient and he chose to proceed with docetaxel. Cycle 1 on 01/21/17  He gets ADT through urology    Interval history- leg swelling much improved. Does report fatigue which is somewhat worse over last 2 weeks. Also reports  intermittent tingling numbness in his fingers  ECOG PS-1 Pain scale- 0 Opioid associated constipation- no  Review of systems- Review of Systems  Constitutional: Positive for malaise/fatigue. Negative for chills, fever and weight loss.  HENT: Negative for congestion, ear discharge and nosebleeds.   Eyes: Negative for blurred vision.  Respiratory: Negative for cough, hemoptysis, sputum production, shortness of breath and wheezing.   Cardiovascular: Positive for leg swelling. Negative for chest pain, palpitations, orthopnea and claudication.  Gastrointestinal: Negative for abdominal pain, blood in stool, constipation, diarrhea, heartburn, melena, nausea and vomiting.  Genitourinary: Negative for dysuria, flank pain, frequency, hematuria and urgency.  Musculoskeletal: Negative for back pain, joint pain and myalgias.  Skin: Negative for rash.  Neurological: Positive for tingling. Negative for dizziness, focal weakness, seizures, weakness and headaches.  Endo/Heme/Allergies: Does not bruise/bleed easily.  Psychiatric/Behavioral: Negative for depression and suicidal ideas. The patient does not have insomnia.       No Known Allergies   Past Medical History:  Diagnosis Date  . Arthritis   . Hepatitis C   . Hyperlipidemia   . Hypertension   . Prostate cancer ([place]) 12/2016  . Stroke [facility])    w left sided weakness per pt (2017)     Past Surgical History:  Procedure Laterality Date  . PORTA CATH INSERTION N/A 01/12/2017   Procedure: Glori Luis Cath Insertion;  Surgeon: Katha Cabal, MD;  Location: [place] [facility];  Service: Cardiovascular;  Laterality: N/A;    Social History   Social History  . Marital status: Married    Spouse name: N/A  . Number of children: N/A  . Years of education: N/A   Occupational History  . Not on file.   Social  History Main Topics  . Smoking status: Former Smoker    Packs/day: 0.25    Years: 20.00    Types: Cigarettes    Quit date: 01/07/1997   . Smokeless tobacco: Never Used  . Alcohol use No  . Drug use: No  . Sexual activity: No   Other Topics Concern  . Not on file   Social History Narrative  . No narrative on file    Family History  Problem Relation Age of Onset  . Cancer Sister   . Prostatitis Brother   . Prostatitis Brother   . Prostate cancer Neg Hx   . Bladder Cancer Neg Hx   . Kidney cancer Neg Hx      Current Outpatient Prescriptions:  .  aspirin EC 81 MG tablet, Take 81 mg by mouth., Disp: , Rfl:  .  atorvastatin (LIPITOR) 40 MG tablet, Take 40 mg by mouth at bedtime. , Disp: , Rfl:  .  cyclobenzaprine (FLEXERIL) 10 MG tablet, TAKE ONE TABLET BY MOUTH AT NIGHT AS NEEDED FOR MUSCLE SPASMS, Disp: , Rfl:  .  dexamethasone (DECADRON) 4 MG tablet, TAKE 2 TABLETS BY MOUTH TWICE DAILY *START THE DAY BEFORE TAXOTERE, THEN DAILY AFTER CHEMO FOR 2 DAYS*, Disp: 30 tablet, Rfl: 0 .  fluconazole (DIFLUCAN) 100 MG tablet, Take 1 tablet (100 mg total) by mouth as directed. Take 2 tablets on first day and then 1 tablet a day for 7 more days, Disp: 9 tablet, Rfl: 0 .  gabapentin (NEURONTIN) 300 MG capsule, Take 1 capsule (300 mg total) by mouth 3 (three) times daily., Disp: 90 capsule, Rfl: 3 .  HYDROcodone-acetaminophen (NORCO) 7.5-325 MG tablet, Take 1 tablet by mouth every 6 (six) hours as needed for moderate pain. , Disp: , Rfl:  .  lisinopril (PRINIVIL,ZESTRIL) 5 MG tablet, Take 5 mg by mouth daily. , Disp: , Rfl:  .  mirtazapine (REMERON) 15 MG tablet, Take 15 mg by mouth at bedtime. , Disp: , Rfl:  .  sertraline (ZOLOFT) 25 MG tablet, Take 25 mg by mouth., Disp: , Rfl:  .  DULoxetine (CYMBALTA) 30 MG capsule, Take 1 capsule (30 mg total) by mouth as directed. Take 1 capsule daily x 1 week and then 2 capsules daily after that, Disp: 49 capsule, Rfl: 0 .  Leuprolide Acetate, 6 Month, (LUPRON DEPOT, 76-MONTH,) 45 MG injection, Inject 45 mg into the muscle every 6 (six) months., Disp: , Rfl:  .  lidocaine-prilocaine  (EMLA) cream, Apply to affected area once (Patient not taking: Reported on 03/04/2017), Disp: 30 g, Rfl: 3 .  LORazepam (ATIVAN) 0.5 MG tablet, Take 1 tablet (0.5 mg total) by mouth every 6 (six) hours as needed (Nausea or vomiting). (Patient not taking: Reported on 03/04/2017), Disp: 30 tablet, Rfl: 0 .  ondansetron (ZOFRAN) 8 MG tablet, Take 1 tablet (8 mg total) by mouth 2 (two) times daily as needed for refractory nausea / vomiting. (Patient not taking: Reported on 03/25/2017), Disp: 30 tablet, Rfl: 1 .  prochlorperazine (COMPAZINE) 10 MG tablet, Take 1 tablet (10 mg total) by mouth every 6 (six) hours as needed (Nausea or vomiting). (Patient not taking: Reported on 03/25/2017), Disp: 30 tablet, Rfl: 1  Physical exam:  Vitals:   03/25/17 1125  BP: (!) 142/92  Pulse: 96  Resp: 18  Temp: 98 F (36.7 C)  TempSrc: Tympanic  Weight: 155 lb 6.4 oz (70.5 kg)   Physical Exam  Constitutional: He is oriented to person, place, and time and  well-developed, well-nourished, and in no distress.  HENT:  Head: Normocephalic and atraumatic.  Eyes: EOM are normal. Pupils are equal, round, and reactive to light.  Neck: Normal range of motion.  Cardiovascular: Normal rate, regular rhythm and normal heart sounds.   Pulmonary/Chest: Effort normal and breath sounds normal.  Abdominal: Soft. Bowel sounds are normal.  Musculoskeletal: He exhibits edema (+1 improved).  Neurological: He is alert and oriented to person, place, and time.  Skin: Skin is warm and dry.     CMP Latest Ref Rng & Units 03/25/2017  Glucose 65 - 99 mg/dL 110(H)  BUN 6 - 20 mg/dL 13  Creatinine 0.61 - 1.24 mg/dL 0.68  Sodium 135 - 145 mmol/L 137  Potassium 3.5 - 5.1 mmol/L 3.5  Chloride 101 - 111 mmol/L 108  CO2 22 - 32 mmol/L 23  Calcium 8.9 - 10.3 mg/dL 8.4(L)  Total Protein 6.5 - 8.1 g/dL 6.3(L)  Total Bilirubin 0.3 - 1.2 mg/dL 0.5  Alkaline Phos 38 - 126 U/L 70  AST 15 - 41 U/L 31  ALT 17 - 63 U/L 24   CBC Latest Ref Rng &  Units 03/25/2017  WBC 3.8 - 10.6 K/uL 6.3  Hemoglobin 13.0 - 18.0 g/dL 10.4(L)  Hematocrit 40.0 - 52.0 % 31.0(L)  Platelets 150 - 440 K/uL 268      Assessment and plan- Patient is a 71 y.o. male with high risk castrate sensitive prostate cancer  Counts ok to proceed with cycle 4 of docetaxel. Continue at lower dose 60 mg/meter square given grade 1 neuropathy and leg edema. Clinically responding well. 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 Increase to 60 mg daily after 1 week  Anemia- likely due to chemotherapy. Also some evidence of iron deficiency on todays labs. He will start taking oral iron   rtc in 3 weeks with labs for cycle 5 of chemotherapy. If neuropathy worsens, will consider stopping chemotherapy prior to 6 cycles   Visit Diagnosis 1. Prostate cancer metastatic to bone ([place])   2. Encounter for antineoplastic chemotherapy   3. Chemotherapy-induced neuropathy ([place])      Dr. Randa Evens, MD, MPH [facility] Pager- [phone] 03/25/2017 10:09 PM

## 2017-03-25 NOTE — Progress Notes (Signed)
Here for follow up. In nad

## 2017-04-15 ENCOUNTER — Inpatient Hospital Stay: Payer: Medicare Other

## 2017-04-15 ENCOUNTER — Inpatient Hospital Stay: Payer: Medicare Other | Attending: Oncology | Admitting: Oncology

## 2017-04-15 ENCOUNTER — Encounter: Payer: Self-pay | Admitting: Oncology

## 2017-04-15 VITALS — BP 120/74 | HR 92 | Temp 97.0°F | Resp 20 | Wt 148.6 lb

## 2017-04-15 DIAGNOSIS — R634 Abnormal weight loss: Secondary | ICD-10-CM | POA: Diagnosis not present

## 2017-04-15 DIAGNOSIS — Z8619 Personal history of other infectious and parasitic diseases: Secondary | ICD-10-CM | POA: Insufficient documentation

## 2017-04-15 DIAGNOSIS — D509 Iron deficiency anemia, unspecified: Secondary | ICD-10-CM | POA: Insufficient documentation

## 2017-04-15 DIAGNOSIS — Z7982 Long term (current) use of aspirin: Secondary | ICD-10-CM | POA: Diagnosis not present

## 2017-04-15 DIAGNOSIS — G620 Drug-induced polyneuropathy: Secondary | ICD-10-CM

## 2017-04-15 DIAGNOSIS — C7951 Secondary malignant neoplasm of bone: Secondary | ICD-10-CM | POA: Insufficient documentation

## 2017-04-15 DIAGNOSIS — Z8673 Personal history of transient ischemic attack (TIA), and cerebral infarction without residual deficits: Secondary | ICD-10-CM | POA: Diagnosis not present

## 2017-04-15 DIAGNOSIS — Z7689 Persons encountering health services in other specified circumstances: Secondary | ICD-10-CM | POA: Insufficient documentation

## 2017-04-15 DIAGNOSIS — M545 Low back pain: Secondary | ICD-10-CM | POA: Insufficient documentation

## 2017-04-15 DIAGNOSIS — Z87891 Personal history of nicotine dependence: Secondary | ICD-10-CM | POA: Insufficient documentation

## 2017-04-15 DIAGNOSIS — R5381 Other malaise: Secondary | ICD-10-CM | POA: Insufficient documentation

## 2017-04-15 DIAGNOSIS — C61 Malignant neoplasm of prostate: Secondary | ICD-10-CM

## 2017-04-15 DIAGNOSIS — Z5111 Encounter for antineoplastic chemotherapy: Secondary | ICD-10-CM | POA: Insufficient documentation

## 2017-04-15 DIAGNOSIS — T451X5S Adverse effect of antineoplastic and immunosuppressive drugs, sequela: Secondary | ICD-10-CM | POA: Diagnosis not present

## 2017-04-15 DIAGNOSIS — E785 Hyperlipidemia, unspecified: Secondary | ICD-10-CM | POA: Diagnosis not present

## 2017-04-15 DIAGNOSIS — R630 Anorexia: Secondary | ICD-10-CM | POA: Insufficient documentation

## 2017-04-15 DIAGNOSIS — D6481 Anemia due to antineoplastic chemotherapy: Secondary | ICD-10-CM | POA: Insufficient documentation

## 2017-04-15 DIAGNOSIS — T451X5A Adverse effect of antineoplastic and immunosuppressive drugs, initial encounter: Secondary | ICD-10-CM

## 2017-04-15 DIAGNOSIS — G8929 Other chronic pain: Secondary | ICD-10-CM | POA: Diagnosis not present

## 2017-04-15 DIAGNOSIS — I10 Essential (primary) hypertension: Secondary | ICD-10-CM | POA: Diagnosis not present

## 2017-04-15 DIAGNOSIS — R5383 Other fatigue: Secondary | ICD-10-CM | POA: Diagnosis not present

## 2017-04-15 DIAGNOSIS — Z79899 Other long term (current) drug therapy: Secondary | ICD-10-CM | POA: Diagnosis not present

## 2017-04-15 LAB — CBC WITH DIFFERENTIAL/PLATELET
Basophils Absolute: 0 10*3/uL (ref 0–0.1)
Basophils Relative: 0 %
EOS PCT: 0 %
Eosinophils Absolute: 0 10*3/uL (ref 0–0.7)
HEMATOCRIT: 31.8 % — AB (ref 40.0–52.0)
Hemoglobin: 10.8 g/dL — ABNORMAL LOW (ref 13.0–18.0)
LYMPHS ABS: 1.1 10*3/uL (ref 1.0–3.6)
LYMPHS PCT: 7 %
MCH: 28.7 pg (ref 26.0–34.0)
MCHC: 33.9 g/dL (ref 32.0–36.0)
MCV: 84.7 fL (ref 80.0–100.0)
Monocytes Absolute: 0.9 10*3/uL (ref 0.2–1.0)
Monocytes Relative: 6 %
NEUTROS ABS: 13.2 10*3/uL — AB (ref 1.4–6.5)
Neutrophils Relative %: 87 %
PLATELETS: 406 10*3/uL (ref 150–440)
RBC: 3.75 MIL/uL — AB (ref 4.40–5.90)
RDW: 18.9 % — ABNORMAL HIGH (ref 11.5–14.5)
WBC: 15.3 10*3/uL — AB (ref 3.8–10.6)

## 2017-04-15 LAB — COMPREHENSIVE METABOLIC PANEL
ALK PHOS: 71 U/L (ref 38–126)
ALT: 30 U/L (ref 17–63)
AST: 38 U/L (ref 15–41)
Albumin: 2.6 g/dL — ABNORMAL LOW (ref 3.5–5.0)
Anion gap: 7 (ref 5–15)
BILIRUBIN TOTAL: 0.6 mg/dL (ref 0.3–1.2)
BUN: 27 mg/dL — AB (ref 6–20)
CALCIUM: 8.7 mg/dL — AB (ref 8.9–10.3)
CHLORIDE: 106 mmol/L (ref 101–111)
CO2: 24 mmol/L (ref 22–32)
CREATININE: 0.75 mg/dL (ref 0.61–1.24)
Glucose, Bld: 132 mg/dL — ABNORMAL HIGH (ref 65–99)
Potassium: 4.2 mmol/L (ref 3.5–5.1)
Sodium: 137 mmol/L (ref 135–145)
TOTAL PROTEIN: 5.6 g/dL — AB (ref 6.5–8.1)

## 2017-04-15 MED ORDER — SODIUM CHLORIDE 0.9 % IV SOLN
Freq: Once | INTRAVENOUS | Status: AC
  Administered 2017-04-15: 11:00:00 via INTRAVENOUS
  Filled 2017-04-15: qty 1000

## 2017-04-15 MED ORDER — PEGFILGRASTIM 6 MG/0.6ML ~~LOC~~ PSKT
6.0000 mg | PREFILLED_SYRINGE | Freq: Once | SUBCUTANEOUS | Status: AC
  Administered 2017-04-15: 6 mg via SUBCUTANEOUS

## 2017-04-15 MED ORDER — MEGESTROL ACETATE 400 MG/10ML PO SUSP: 400.0000 mg | Freq: Two times a day (BID) | ORAL | 0 refills | Status: DC

## 2017-04-15 MED ORDER — SODIUM CHLORIDE 0.9% FLUSH
10.0000 mL | INTRAVENOUS | Status: DC | PRN
  Administered 2017-04-15: 10 mL via INTRAVENOUS
  Filled 2017-04-15: qty 10

## 2017-04-15 MED ORDER — DEXAMETHASONE SODIUM PHOSPHATE 10 MG/ML IJ SOLN
10.0000 mg | Freq: Once | INTRAMUSCULAR | Status: AC
  Administered 2017-04-15: 10 mg via INTRAVENOUS

## 2017-04-15 MED ORDER — SODIUM CHLORIDE 0.9 % IV SOLN
60.0000 mg/m2 | Freq: Once | INTRAVENOUS | Status: AC
  Administered 2017-04-15: 110 mg via INTRAVENOUS
  Filled 2017-04-15: qty 11

## 2017-04-15 MED ORDER — HEPARIN SOD (PORK) LOCK FLUSH 100 UNIT/ML IV SOLN
500.0000 [IU] | Freq: Once | INTRAVENOUS | Status: AC
  Administered 2017-04-15: 500 [IU] via INTRAVENOUS

## 2017-04-15 NOTE — Progress Notes (Signed)
Here for follow up

## 2017-04-15 NOTE — Progress Notes (Signed)
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 Hollice Espy for elevated PSA. PSA on 12/03/2016 was 362.9 and patient was also found to have enlarged hard nodular prostate involving the left half of the gland.patient received his first dose of Firmagon injection on 12/07/2016. He also underwent prostate biopsy which showedprosthetic adenocarcinoma in 4 out of 6 cores submitted. Gleason score of 7.  CT abdomen pelvis from 12/11/2016 showed diffuse sclerotic osseous metastases throughout the actual and appendicular skeleton. Subcentimeter low attenuation lesions in the kidney and liver too small to characterized but favored to represent cysts.bone scan showed widespread bony metastatic disease involving bilateral scapula, cervical/thoracic/lumbar spine, pelvic bones, proximal femurs and ribs and likely the calvarium.Nearly super scan appearance.patient reports that he has lost about 20 pounds of weightover the last few months.   He lives with his wife and extended family at home and is independent of his ADLs and IADLs.he reports chronic low back pain but denies other complaints  CT thorax showed no lung or LN mets.   Risks and benefits of both abiraterone and docetaxel were discussed with the patient and he chose to proceed with docetaxel. Cycle 1 on 01/21/17  He gets ADT through urology   Interval history- reports that his appetite is poor. He has lost about 9 pounds in 2 months. He is drinking 3 cans of  ensure a day. Neuropathy is improving  ECOG PS- 1 Pain scale- 7 Opioid associated constipation- no  Review of systems- Review of Systems  Constitutional: Positive for malaise/fatigue. Negative for chills, fever and weight loss.  HENT: Negative for congestion, ear discharge and nosebleeds.   Eyes: Negative for blurred vision.  Respiratory: Negative for cough, hemoptysis, sputum production, shortness of breath and wheezing.   Cardiovascular: Negative for chest pain, palpitations, orthopnea and claudication.  Gastrointestinal: Negative for abdominal pain, blood in stool, constipation, diarrhea, heartburn, melena, nausea and vomiting.  Genitourinary: Negative for dysuria, flank pain, frequency, hematuria and urgency.  Musculoskeletal: Negative for back pain, joint pain and myalgias.  Skin: Negative for rash.  Neurological: Positive for weakness. Negative for dizziness, tingling, focal weakness, seizures and headaches.  Endo/Heme/Allergies: Does not bruise/bleed easily.  Psychiatric/Behavioral: Negative for depression and suicidal ideas. The patient does not have insomnia.       No Known Allergies   Past Medical History:  Diagnosis Date  . Arthritis   . Hepatitis C   . Hyperlipidemia   . Hypertension   . Prostate cancer ([place]) 12/2016  . Stroke [facility])    w left sided weakness per pt (2017)     Past Surgical History:  Procedure Laterality Date  . PORTA CATH INSERTION N/A 01/12/2017   Procedure: Glori Luis Cath Insertion;  Surgeon: Katha Cabal, MD;  Location: [place] [facility];  Service: Cardiovascular;  Laterality: N/A;    Social History   Social History  . Marital status: Married    Spouse name: N/A  . Number of children: N/A  . Years of education: N/A   Occupational History  . Not on file.  Social History Main Topics  . Smoking status: Former Smoker    Packs/day: 0.25    Years: 20.00    Types: Cigarettes    Quit date: 01/07/1997  . Smokeless tobacco: Never Used   . Alcohol use No  . Drug use: No  . Sexual activity: No   Other Topics Concern  . Not on file   Social History Narrative  . No narrative on file    Family History  Problem Relation Age of Onset  . Cancer Sister   . Prostatitis Brother   . Prostatitis Brother   . Prostate cancer Neg Hx   . Bladder Cancer Neg Hx   . Kidney cancer Neg Hx      Current Outpatient Prescriptions:  .  aspirin EC 81 MG tablet, Take 81 mg by mouth., Disp: , Rfl:  .  atorvastatin (LIPITOR) 40 MG tablet, Take 40 mg by mouth at bedtime. , Disp: , Rfl:  .  cyclobenzaprine (FLEXERIL) 10 MG tablet, TAKE ONE TABLET BY MOUTH AT NIGHT AS NEEDED FOR MUSCLE SPASMS, Disp: , Rfl:  .  dexamethasone (DECADRON) 4 MG tablet, TAKE 2 TABLETS BY MOUTH TWICE DAILY *START THE DAY BEFORE TAXOTERE, THEN DAILY AFTER CHEMO FOR 2 DAYS*, Disp: 30 tablet, Rfl: 0 .  DULoxetine (CYMBALTA) 30 MG capsule, Take 1 capsule (30 mg total) by mouth as directed. Take 1 capsule daily x 1 week and then 2 capsules daily after that, Disp: 49 capsule, Rfl: 0 .  gabapentin (NEURONTIN) 300 MG capsule, Take 1 capsule (300 mg total) by mouth 3 (three) times daily., Disp: 90 capsule, Rfl: 3 .  HYDROcodone-acetaminophen (NORCO) 7.5-325 MG tablet, Take 1 tablet by mouth every 6 (six) hours as needed for moderate pain. , Disp: , Rfl:  .  lidocaine-prilocaine (EMLA) cream, Apply to affected area once, Disp: 30 g, Rfl: 3 .  lisinopril (PRINIVIL,ZESTRIL) 5 MG tablet, Take 5 mg by mouth daily. , Disp: , Rfl:  .  LORazepam (ATIVAN) 0.5 MG tablet, Take 1 tablet (0.5 mg total) by mouth every 6 (six) hours as needed (Nausea or vomiting)., Disp: 30 tablet, Rfl: 0 .  mirtazapine (REMERON) 15 MG tablet, Take 15 mg by mouth at bedtime. , Disp: , Rfl:  .  ondansetron (ZOFRAN) 8 MG tablet, Take 1 tablet (8 mg total) by mouth 2 (two) times daily as needed for refractory nausea / vomiting., Disp: 30 tablet, Rfl: 1 .  sertraline (ZOLOFT) 25 MG tablet, Take 25 mg by mouth.,  Disp: , Rfl:  .  fluconazole (DIFLUCAN) 100 MG tablet, Take 1 tablet (100 mg total) by mouth as directed. Take 2 tablets on first day and then 1 tablet a day for 7 more days (Patient not taking: Reported on 04/15/2017), Disp: 9 tablet, Rfl: 0 .  Leuprolide Acetate, 6 Month, (LUPRON DEPOT, 14-MONTH,) 45 MG injection, Inject 45 mg into the muscle every 6 (six) months., Disp: , Rfl:  .  megestrol (MEGACE) 400 MG/10ML suspension, Take 10 mLs (400 mg total) by mouth 2 (two) times daily., Disp: 600 mL, Rfl: 0 .  prochlorperazine (COMPAZINE) 10 MG tablet, Take 1 tablet (10 mg total) by mouth every 6 (six) hours as needed (Nausea or vomiting). (Patient not taking: Reported on 03/25/2017), Disp: 30 tablet, Rfl: 1 No current facility-administered medications for this visit.   Facility-Administered Medications Ordered in Other Visits:  .  sodium chloride flush (NS) 0.9 % injection 10 mL, 10 mL, Intravenous, PRN, Sindy Guadeloupe, MD, 10 mL at  04/15/17 0950  Physical exam:  Vitals:   04/15/17 1018  BP: 120/74  Pulse: 92  Resp: 20  Temp: (!) 97 F (36.1 C)  TempSrc: Tympanic  Weight: 148 lb 9.6 oz (67.4 kg)   Physical Exam  Constitutional: He is oriented to person, place, and time and well-developed, well-nourished, and in no distress.  HENT:  Head: Normocephalic and atraumatic.  Mouth/Throat: Oropharynx is clear and moist.  Eyes: Pupils are equal, round, and reactive to light. EOM are normal.  Neck: Normal range of motion.  Cardiovascular: Normal rate, regular rhythm and normal heart sounds.   Pulmonary/Chest: Effort normal and breath sounds normal.  Abdominal: Soft. Bowel sounds are normal.  Musculoskeletal: He exhibits edema (b/l +1).  Neurological: He is alert and oriented to person, place, and time.  Skin: Skin is warm and dry.     CMP Latest Ref Rng & Units 04/15/2017  Glucose 65 - 99 mg/dL 132(H)  BUN 6 - 20 mg/dL 27(H)  Creatinine 0.61 - 1.24 mg/dL 0.75  Sodium 135 - 145 mmol/L 137    Potassium 3.5 - 5.1 mmol/L 4.2  Chloride 101 - 111 mmol/L 106  CO2 22 - 32 mmol/L 24  Calcium 8.9 - 10.3 mg/dL 8.7(L)  Total Protein 6.5 - 8.1 g/dL 5.6(L)  Total Bilirubin 0.3 - 1.2 mg/dL 0.6  Alkaline Phos 38 - 126 U/L 71  AST 15 - 41 U/L 38  ALT 17 - 63 U/L 30   CBC Latest Ref Rng & Units 04/15/2017  WBC 3.8 - 10.6 K/uL 15.3(H)  Hemoglobin 13.0 - 18.0 g/dL 10.8(L)  Hematocrit 40.0 - 52.0 % 31.8(L)  Platelets 150 - 440 K/uL 406     Assessment and plan- Patient is a 71 y.o. male with high risk castrate sensitive prostate cancer here for on treatment assessment prior to cycle 5 of docetaxel  Counts ok to proceed with cycle 5 of docetaxel today with onpro neulasta. Dose reduced due to neuropathy. rtc in 3 weeks with cbc, cmp and psa  Chemo induced neuropathy- continue cymbalta. Improving  Weight loss- megace given for appetite stimulation  cancer associated fatigue- continue to monitor. If it worsens, I might hold cycle 6 of docetaxel  Anemia- secondary to chemotherapy and iron deficiency. Continue oral iron     Visit Diagnosis 1. Chemotherapy-induced neuropathy ([place])   2. Prostate cancer metastatic to bone ([place])   3. Encounter for antineoplastic chemotherapy      Dr. Randa Evens, MD, MPH [facility] Pager- [phone] 04/15/2017 2:02 PM

## 2017-04-20 ENCOUNTER — Other Ambulatory Visit: Payer: Self-pay | Admitting: Oncology

## 2017-04-20 DIAGNOSIS — C7951 Secondary malignant neoplasm of bone: Principal | ICD-10-CM

## 2017-04-20 DIAGNOSIS — C61 Malignant neoplasm of prostate: Secondary | ICD-10-CM

## 2017-05-06 ENCOUNTER — Inpatient Hospital Stay: Payer: Medicare Other | Attending: Oncology

## 2017-05-06 ENCOUNTER — Encounter: Payer: Self-pay | Admitting: Oncology

## 2017-05-06 ENCOUNTER — Inpatient Hospital Stay (HOSPITAL_BASED_OUTPATIENT_CLINIC_OR_DEPARTMENT_OTHER): Payer: Medicare Other | Admitting: Oncology

## 2017-05-06 ENCOUNTER — Inpatient Hospital Stay: Payer: Medicare Other

## 2017-05-06 VITALS — BP 133/82 | HR 108 | Temp 99.7°F | Resp 20 | Wt 142.3 lb

## 2017-05-06 DIAGNOSIS — M545 Low back pain: Secondary | ICD-10-CM

## 2017-05-06 DIAGNOSIS — I10 Essential (primary) hypertension: Secondary | ICD-10-CM | POA: Diagnosis not present

## 2017-05-06 DIAGNOSIS — G8929 Other chronic pain: Secondary | ICD-10-CM | POA: Diagnosis not present

## 2017-05-06 DIAGNOSIS — R5381 Other malaise: Secondary | ICD-10-CM

## 2017-05-06 DIAGNOSIS — C7951 Secondary malignant neoplasm of bone: Secondary | ICD-10-CM | POA: Insufficient documentation

## 2017-05-06 DIAGNOSIS — Z8619 Personal history of other infectious and parasitic diseases: Secondary | ICD-10-CM | POA: Insufficient documentation

## 2017-05-06 DIAGNOSIS — Z7982 Long term (current) use of aspirin: Secondary | ICD-10-CM | POA: Diagnosis not present

## 2017-05-06 DIAGNOSIS — C61 Malignant neoplasm of prostate: Secondary | ICD-10-CM | POA: Diagnosis not present

## 2017-05-06 DIAGNOSIS — Z79899 Other long term (current) drug therapy: Secondary | ICD-10-CM | POA: Diagnosis not present

## 2017-05-06 DIAGNOSIS — T451X5S Adverse effect of antineoplastic and immunosuppressive drugs, sequela: Secondary | ICD-10-CM

## 2017-05-06 DIAGNOSIS — N400 Enlarged prostate without lower urinary tract symptoms: Secondary | ICD-10-CM

## 2017-05-06 DIAGNOSIS — Z87891 Personal history of nicotine dependence: Secondary | ICD-10-CM | POA: Diagnosis not present

## 2017-05-06 DIAGNOSIS — I69354 Hemiplegia and hemiparesis following cerebral infarction affecting left non-dominant side: Secondary | ICD-10-CM

## 2017-05-06 DIAGNOSIS — Z5111 Encounter for antineoplastic chemotherapy: Secondary | ICD-10-CM | POA: Diagnosis present

## 2017-05-06 DIAGNOSIS — R634 Abnormal weight loss: Secondary | ICD-10-CM | POA: Insufficient documentation

## 2017-05-06 DIAGNOSIS — R5383 Other fatigue: Secondary | ICD-10-CM

## 2017-05-06 DIAGNOSIS — E785 Hyperlipidemia, unspecified: Secondary | ICD-10-CM

## 2017-05-06 DIAGNOSIS — R600 Localized edema: Secondary | ICD-10-CM | POA: Diagnosis not present

## 2017-05-06 DIAGNOSIS — T451X5A Adverse effect of antineoplastic and immunosuppressive drugs, initial encounter: Secondary | ICD-10-CM

## 2017-05-06 DIAGNOSIS — R200 Anesthesia of skin: Secondary | ICD-10-CM | POA: Diagnosis not present

## 2017-05-06 DIAGNOSIS — G620 Drug-induced polyneuropathy: Secondary | ICD-10-CM | POA: Insufficient documentation

## 2017-05-06 LAB — CBC WITH DIFFERENTIAL/PLATELET
Basophils Absolute: 0.1 10*3/uL (ref 0–0.1)
Basophils Relative: 1 %
Eosinophils Absolute: 0.3 10*3/uL (ref 0–0.7)
Eosinophils Relative: 2 %
HEMATOCRIT: 33 % — AB (ref 40.0–52.0)
HEMOGLOBIN: 11.1 g/dL — AB (ref 13.0–18.0)
LYMPHS ABS: 1.6 10*3/uL (ref 1.0–3.6)
LYMPHS PCT: 9 %
MCH: 29 pg (ref 26.0–34.0)
MCHC: 33.6 g/dL (ref 32.0–36.0)
MCV: 86.1 fL (ref 80.0–100.0)
MONO ABS: 1.5 10*3/uL — AB (ref 0.2–1.0)
MONOS PCT: 9 %
NEUTROS ABS: 14.1 10*3/uL — AB (ref 1.4–6.5)
NEUTROS PCT: 79 %
Platelets: 376 10*3/uL (ref 150–440)
RBC: 3.84 MIL/uL — ABNORMAL LOW (ref 4.40–5.90)
RDW: 21.5 % — AB (ref 11.5–14.5)
WBC: 17.6 10*3/uL — ABNORMAL HIGH (ref 3.8–10.6)

## 2017-05-06 LAB — PSA: PROSTATIC SPECIFIC ANTIGEN: 2.61 ng/mL (ref 0.00–4.00)

## 2017-05-06 LAB — COMPREHENSIVE METABOLIC PANEL
ALK PHOS: 79 U/L (ref 38–126)
ALT: 25 U/L (ref 17–63)
ANION GAP: 6 (ref 5–15)
AST: 30 U/L (ref 15–41)
Albumin: 2.4 g/dL — ABNORMAL LOW (ref 3.5–5.0)
BUN: 15 mg/dL (ref 6–20)
CALCIUM: 8.1 mg/dL — AB (ref 8.9–10.3)
CO2: 22 mmol/L (ref 22–32)
CREATININE: 0.68 mg/dL (ref 0.61–1.24)
Chloride: 103 mmol/L (ref 101–111)
Glucose, Bld: 96 mg/dL (ref 65–99)
Potassium: 4.3 mmol/L (ref 3.5–5.1)
SODIUM: 131 mmol/L — AB (ref 135–145)
TOTAL PROTEIN: 5.4 g/dL — AB (ref 6.5–8.1)
Total Bilirubin: 0.7 mg/dL (ref 0.3–1.2)

## 2017-05-06 MED ORDER — SODIUM CHLORIDE 0.9 % IV SOLN
Freq: Once | INTRAVENOUS | Status: AC
  Administered 2017-05-06: 11:00:00 via INTRAVENOUS
  Filled 2017-05-06: qty 1000

## 2017-05-06 MED ORDER — SODIUM CHLORIDE 0.9 % IV SOLN
60.0000 mg/m2 | Freq: Once | INTRAVENOUS | Status: AC
  Administered 2017-05-06: 110 mg via INTRAVENOUS
  Filled 2017-05-06: qty 11

## 2017-05-06 MED ORDER — PEGFILGRASTIM 6 MG/0.6ML ~~LOC~~ PSKT
6.0000 mg | PREFILLED_SYRINGE | Freq: Once | SUBCUTANEOUS | Status: AC
  Administered 2017-05-06: 6 mg via SUBCUTANEOUS
  Filled 2017-05-06: qty 0.6

## 2017-05-06 MED ORDER — HEPARIN SOD (PORK) LOCK FLUSH 100 UNIT/ML IV SOLN
500.0000 [IU] | Freq: Once | INTRAVENOUS | Status: AC | PRN
  Administered 2017-05-06: 500 [IU]
  Filled 2017-05-06: qty 5

## 2017-05-06 MED ORDER — DEXAMETHASONE SODIUM PHOSPHATE 10 MG/ML IJ SOLN
10.0000 mg | Freq: Once | INTRAMUSCULAR | Status: AC
  Administered 2017-05-06: 10 mg via INTRAVENOUS
  Filled 2017-05-06: qty 1

## 2017-05-06 NOTE — Progress Notes (Signed)
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 Hollice Espy for elevated PSA. PSA on 12/03/2016 was 362.9 and patient was also found to have enlarged hard nodular prostate involving the left half of the gland.patient received his first dose of Firmagon injection on 12/07/2016. He also underwent prostate biopsy which showedprosthetic adenocarcinoma in 4 out of 6 cores submitted. Gleason score of 7.  CT abdomen pelvis from 12/11/2016 showed diffuse sclerotic osseous metastases throughout the actual and appendicular skeleton. Subcentimeter low attenuation lesions in the kidney and liver too small to characterized but favored to represent cysts.bone scan showed widespread bony metastatic disease involving bilateral scapula, cervical/thoracic/lumbar spine, pelvic bones, proximal femurs and ribs and likely the calvarium.Nearly super scan appearance.patient reports that he has lost about 20 pounds of weightover the last few months.   He lives with his wife and extended family at home and is independent of his ADLs and IADLs.he reports chronic low back pain but denies other complaints  CT thorax showed no lung or LN mets.   Risks and benefits of both abiraterone and docetaxel were discussed with the patient and he chose to proceed with docetaxel. Cycle 1 on 01/21/17  He gets ADT through urology. Due for next dose in nov 2018  Interval history- leg swelling is better. Sometimes his right leg feels weaker. Intermittent  numbness in his feet. cymbalta makes him feel sleepy  ECOG PS- 1 Pain scale- 0   Review of systems- Review of Systems  Constitutional: Positive for malaise/fatigue. Negative for chills, fever and weight loss.  HENT: Negative for congestion, ear discharge and nosebleeds.   Eyes: Negative for blurred vision.  Respiratory: Negative for cough, hemoptysis, sputum production, shortness of breath and wheezing.   Cardiovascular: Negative for chest pain, palpitations, orthopnea and claudication.  Gastrointestinal: Negative for abdominal pain, blood in stool, constipation, diarrhea, heartburn, melena, nausea and vomiting.  Genitourinary: Negative for dysuria, flank pain, frequency, hematuria and urgency.  Musculoskeletal: Negative for back pain, joint pain and myalgias.  Skin: Negative for rash.  Neurological: Positive for tingling. Negative for dizziness, focal weakness, seizures, weakness and headaches.  Endo/Heme/Allergies: Does not bruise/bleed easily.  Psychiatric/Behavioral: Negative for depression and suicidal ideas. The patient does not have insomnia.      No Known Allergies   Past Medical History:  Diagnosis Date  . Arthritis   . Hepatitis C   . Hyperlipidemia   . Hypertension   . Prostate cancer ([place]) 12/2016  . Stroke [facility])    w left sided weakness per pt (2017)     Past Surgical History:  Procedure Laterality Date  . PORTA CATH INSERTION N/A 01/12/2017   Procedure: Glori Luis Cath Insertion;  Surgeon: Katha Cabal, MD;  Location: [place] [facility];  Service: Cardiovascular;  Laterality: N/A;    Social History   Social History  . Marital status: Married    Spouse name: N/A  . Number of children: N/A  . Years of education: N/A   Occupational History  . Not on file.   Social History Main Topics  .  Smoking status: Former Smoker    Packs/day: 0.25    Years: 20.00    Types: Cigarettes    Quit date: 01/07/1997  . Smokeless tobacco: Never Used  . Alcohol use No    . Drug use: No  . Sexual activity: No   Other Topics Concern  . Not on file   Social History Narrative  . No narrative on file    Family History  Problem Relation Age of Onset  . Cancer Sister   . Prostatitis Brother   . Prostatitis Brother   . Prostate cancer Neg Hx   . Bladder Cancer Neg Hx   . Kidney cancer Neg Hx      Current Outpatient Prescriptions:  .  aspirin EC 81 MG tablet, Take 81 mg by mouth., Disp: , Rfl:  .  atorvastatin (LIPITOR) 40 MG tablet, Take 40 mg by mouth at bedtime. , Disp: , Rfl:  .  cyclobenzaprine (FLEXERIL) 10 MG tablet, TAKE ONE TABLET BY MOUTH AT NIGHT AS NEEDED FOR MUSCLE SPASMS, Disp: , Rfl:  .  dexamethasone (DECADRON) 4 MG tablet, TAKE 2 TABLETS BY MOUTH TWICE DAILY *START THE DAY BEFORE TAXOTERE, THEN DAILY AFTER CHEMO FOR 2 DAYS*, Disp: 30 tablet, Rfl: 0 .  dexamethasone (DECADRON) 4 MG tablet, TAKE 2 TABLETS BY MOUTH TWICE DAILY *START THE DAY BEFORE TAXOTERE, THEN DAILY AFTER CHEMO FOR 2 DAYS*, Disp: 30 tablet, Rfl: 1 .  DULoxetine (CYMBALTA) 30 MG capsule, Take 1 capsule (30 mg total) by mouth as directed. Take 1 capsule daily x 1 week and then 2 capsules daily after that, Disp: 49 capsule, Rfl: 0 .  gabapentin (NEURONTIN) 300 MG capsule, Take 1 capsule (300 mg total) by mouth 3 (three) times daily., Disp: 90 capsule, Rfl: 3 .  HYDROcodone-acetaminophen (NORCO) 7.5-325 MG tablet, Take 1 tablet by mouth every 6 (six) hours as needed for moderate pain. , Disp: , Rfl:  .  Leuprolide Acetate, 6 Month, (LUPRON DEPOT, 31-MONTH,) 45 MG injection, Inject 45 mg into the muscle every 6 (six) months., Disp: , Rfl:  .  lidocaine-prilocaine (EMLA) cream, Apply to affected area once, Disp: 30 g, Rfl: 3 .  lisinopril (PRINIVIL,ZESTRIL) 5 MG tablet, Take 5 mg by mouth daily. , Disp: , Rfl:  .  megestrol (MEGACE) 400 MG/10ML suspension, Take 10 mLs (400 mg total) by mouth 2 (two) times daily., Disp: 600 mL, Rfl: 0 .  mirtazapine (REMERON) 15 MG tablet, Take 15  mg by mouth at bedtime. , Disp: , Rfl:  .  ondansetron (ZOFRAN) 8 MG tablet, Take 1 tablet (8 mg total) by mouth 2 (two) times daily as needed for refractory nausea / vomiting., Disp: 30 tablet, Rfl: 1 .  sertraline (ZOLOFT) 25 MG tablet, Take 25 mg by mouth., Disp: , Rfl:  .  fluconazole (DIFLUCAN) 100 MG tablet, Take 1 tablet (100 mg total) by mouth as directed. Take 2 tablets on first day and then 1 tablet a day for 7 more days (Patient not taking: Reported on 04/15/2017), Disp: 9 tablet, Rfl: 0 .  LORazepam (ATIVAN) 0.5 MG tablet, Take 1 tablet (0.5 mg total) by mouth every 6 (six) hours as needed (Nausea or vomiting). (Patient not taking: Reported on 05/06/2017), Disp: 30 tablet, Rfl: 0 .  prochlorperazine (COMPAZINE) 10 MG tablet, Take 1 tablet (10 mg total) by mouth every 6 (six) hours as needed (Nausea or vomiting). (Patient not taking: Reported on 03/25/2017), Disp: 30 tablet, Rfl: 1  Physical exam:  Vitals:  05/06/17 0954  BP: 133/82  Pulse: (!) 108  Resp: 20  Temp: 99.7 F (37.6 C)  TempSrc: Tympanic  Weight: 142 lb 5 oz (64.6 kg)   Physical Exam  Constitutional: He is oriented to person, place, and time and well-developed, well-nourished, and in no distress.  HENT:  Head: Normocephalic and atraumatic.  Eyes: Pupils are equal, round, and reactive to light. EOM are normal.  Neck: Normal range of motion.  Cardiovascular: Normal rate, regular rhythm and normal heart sounds.   Pulmonary/Chest: Effort normal and breath sounds normal.  Abdominal: Soft. Bowel sounds are normal.  Musculoskeletal: He exhibits edema (trace).  Neurological: He is alert and oriented to person, place, and time.  proprioception intact in b/l LE. Sensory exam normal b/l LE. No motor weakness.  Skin: Skin is warm and dry.     CMP Latest Ref Rng & Units 05/06/2017  Glucose 65 - 99 mg/dL 96  BUN 6 - 20 mg/dL 15  Creatinine 0.61 - 1.24 mg/dL 0.68  Sodium 135 - 145 mmol/L 131(L)  Potassium 3.5 - 5.1 mmol/L  4.3  Chloride 101 - 111 mmol/L 103  CO2 22 - 32 mmol/L 22  Calcium 8.9 - 10.3 mg/dL 8.1(L)  Total Protein 6.5 - 8.1 g/dL 5.4(L)  Total Bilirubin 0.3 - 1.2 mg/dL 0.7  Alkaline Phos 38 - 126 U/L 79  AST 15 - 41 U/L 30  ALT 17 - 63 U/L 25   CBC Latest Ref Rng & Units 05/06/2017  WBC 3.8 - 10.6 K/uL 17.6(H)  Hemoglobin 13.0 - 18.0 g/dL 11.1(L)  Hematocrit 40.0 - 52.0 % 33.0(L)  Platelets 150 - 440 K/uL 376    Assessment and plan- Patient is a 71 y.o. male with high risk castrate sensitive prostate cancer here for on treatment assessment prior to cycle 6 of docetaxel  Counts ok to proceed with cycle 6 of docetaxel today with onpro neulasta. Dose reduced due to neuropathy. rtc in 4 weeks with cbc, cmp and psa. Repeat ct abdomen and bone scan to assess response to therapy in 2 weeks. Clinically psa has and alk phos has normalized.   Chemo induced neuropathy- continue cymbalta. No evidence of sensory neuropathy on todays exam. He also had prior stroke and had some LE weakness at that time. I will therefore proceed with cycle 6 of chemotherapy which would be his last dose  Weight loss-  On megace. Continue to monitor. He did lose 6 more pounds since last 3 weeks. Will re assess at next visit and refer to dietician if it continues    Visit Diagnosis 1. Prostate cancer metastatic to bone ([place])   2. Chemotherapy-induced neuropathy ([place])   3. Encounter for antineoplastic chemotherapy      Dr. Randa Evens, MD, MPH [facility] Pager- [phone] 05/06/2017 12:28 PM

## 2017-05-06 NOTE — Progress Notes (Signed)
Patient states he is having SOB.  O2 saturation today 96%.  States he feels tired all the time.  Is not eating well but is drinking Ensure.  Temperature 99.7.

## 2017-05-18 ENCOUNTER — Ambulatory Visit
Admission: RE | Admit: 2017-05-18 | Discharge: 2017-05-18 | Disposition: A | Discharge status: Home / Self Care | Payer: Medicare Other | Source: Ambulatory Visit | Attending: Oncology | Admitting: Oncology

## 2017-05-18 ENCOUNTER — Encounter
Admission: RE | Admit: 2017-05-18 | Discharge: 2017-05-18 | Disposition: A | Discharge status: Home / Self Care | Payer: Medicare Other | Source: Ambulatory Visit | Attending: Oncology | Admitting: Oncology

## 2017-05-18 DIAGNOSIS — K8020 Calculus of gallbladder without cholecystitis without obstruction: Secondary | ICD-10-CM | POA: Insufficient documentation

## 2017-05-18 DIAGNOSIS — I700 Atherosclerosis of aorta: Secondary | ICD-10-CM | POA: Insufficient documentation

## 2017-05-18 DIAGNOSIS — C7951 Secondary malignant neoplasm of bone: Principal | ICD-10-CM

## 2017-05-18 DIAGNOSIS — C61 Malignant neoplasm of prostate: Secondary | ICD-10-CM | POA: Diagnosis not present

## 2017-05-18 DIAGNOSIS — R932 Abnormal findings on diagnostic imaging of liver and biliary tract: Secondary | ICD-10-CM | POA: Insufficient documentation

## 2017-05-18 MED ORDER — IOPAMIDOL (ISOVUE-300) INJECTION 61%
100.0000 mL | Freq: Once | INTRAVENOUS | Status: AC | PRN
  Administered 2017-05-18: 100 mL via INTRAVENOUS

## 2017-05-18 MED ORDER — TECHNETIUM TC 99M MEDRONATE IV KIT
22.2200 | PACK | Freq: Once | INTRAVENOUS | Status: AC | PRN
  Administered 2017-05-18: 22.22 via INTRAVENOUS

## 2017-05-29 ENCOUNTER — Inpatient Hospital Stay: Payer: Medicare Other

## 2017-05-29 ENCOUNTER — Inpatient Hospital Stay
Admission: EM | Admit: 2017-05-29 | Discharge: 2017-06-01 | DRG: 871 | Disposition: A | Discharge status: Hospice | Payer: Medicare Other | Attending: Internal Medicine | Admitting: Internal Medicine

## 2017-05-29 ENCOUNTER — Emergency Department: Payer: Medicare Other

## 2017-05-29 ENCOUNTER — Encounter: Payer: Self-pay | Admitting: Emergency Medicine

## 2017-05-29 DIAGNOSIS — E876 Hypokalemia: Secondary | ICD-10-CM | POA: Diagnosis present

## 2017-05-29 DIAGNOSIS — A419 Sepsis, unspecified organism: Principal | ICD-10-CM | POA: Diagnosis present

## 2017-05-29 DIAGNOSIS — D638 Anemia in other chronic diseases classified elsewhere: Secondary | ICD-10-CM | POA: Diagnosis present

## 2017-05-29 DIAGNOSIS — Z79899 Other long term (current) drug therapy: Secondary | ICD-10-CM

## 2017-05-29 DIAGNOSIS — E872 Acidosis: Secondary | ICD-10-CM | POA: Diagnosis present

## 2017-05-29 DIAGNOSIS — Z87891 Personal history of nicotine dependence: Secondary | ICD-10-CM

## 2017-05-29 DIAGNOSIS — I82441 Acute embolism and thrombosis of right tibial vein: Secondary | ICD-10-CM | POA: Diagnosis present

## 2017-05-29 DIAGNOSIS — Z9889 Other specified postprocedural states: Secondary | ICD-10-CM

## 2017-05-29 DIAGNOSIS — E43 Unspecified severe protein-calorie malnutrition: Secondary | ICD-10-CM | POA: Diagnosis present

## 2017-05-29 DIAGNOSIS — Z681 Body mass index (BMI) 19 or less, adult: Secondary | ICD-10-CM | POA: Diagnosis not present

## 2017-05-29 DIAGNOSIS — Z8619 Personal history of other infectious and parasitic diseases: Secondary | ICD-10-CM

## 2017-05-29 DIAGNOSIS — J189 Pneumonia, unspecified organism: Secondary | ICD-10-CM | POA: Diagnosis present

## 2017-05-29 DIAGNOSIS — Z66 Do not resuscitate: Secondary | ICD-10-CM | POA: Diagnosis not present

## 2017-05-29 DIAGNOSIS — C61 Malignant neoplasm of prostate: Secondary | ICD-10-CM

## 2017-05-29 DIAGNOSIS — L8990 Pressure ulcer of unspecified site, unspecified stage: Secondary | ICD-10-CM | POA: Insufficient documentation

## 2017-05-29 DIAGNOSIS — M7989 Other specified soft tissue disorders: Secondary | ICD-10-CM

## 2017-05-29 DIAGNOSIS — R0602 Shortness of breath: Secondary | ICD-10-CM | POA: Diagnosis not present

## 2017-05-29 DIAGNOSIS — C7951 Secondary malignant neoplasm of bone: Secondary | ICD-10-CM | POA: Diagnosis present

## 2017-05-29 DIAGNOSIS — I82431 Acute embolism and thrombosis of right popliteal vein: Secondary | ICD-10-CM | POA: Diagnosis present

## 2017-05-29 DIAGNOSIS — Z515 Encounter for palliative care: Secondary | ICD-10-CM | POA: Diagnosis not present

## 2017-05-29 DIAGNOSIS — Z7189 Other specified counseling: Secondary | ICD-10-CM

## 2017-05-29 DIAGNOSIS — I10 Essential (primary) hypertension: Secondary | ICD-10-CM | POA: Diagnosis present

## 2017-05-29 DIAGNOSIS — I824Z1 Acute embolism and thrombosis of unspecified deep veins of right distal lower extremity: Secondary | ICD-10-CM | POA: Diagnosis present

## 2017-05-29 DIAGNOSIS — I69354 Hemiplegia and hemiparesis following cerebral infarction affecting left non-dominant side: Secondary | ICD-10-CM | POA: Diagnosis not present

## 2017-05-29 DIAGNOSIS — J9601 Acute respiratory failure with hypoxia: Secondary | ICD-10-CM | POA: Diagnosis present

## 2017-05-29 DIAGNOSIS — R6520 Severe sepsis without septic shock: Secondary | ICD-10-CM | POA: Diagnosis present

## 2017-05-29 DIAGNOSIS — I82411 Acute embolism and thrombosis of right femoral vein: Secondary | ICD-10-CM | POA: Diagnosis present

## 2017-05-29 DIAGNOSIS — Z79891 Long term (current) use of opiate analgesic: Secondary | ICD-10-CM

## 2017-05-29 DIAGNOSIS — I2699 Other pulmonary embolism without acute cor pulmonale: Secondary | ICD-10-CM | POA: Diagnosis present

## 2017-05-29 DIAGNOSIS — Z9221 Personal history of antineoplastic chemotherapy: Secondary | ICD-10-CM

## 2017-05-29 DIAGNOSIS — E785 Hyperlipidemia, unspecified: Secondary | ICD-10-CM | POA: Diagnosis present

## 2017-05-29 DIAGNOSIS — Z7982 Long term (current) use of aspirin: Secondary | ICD-10-CM

## 2017-05-29 LAB — COMPREHENSIVE METABOLIC PANEL
ALT: 22 U/L (ref 17–63)
AST: 32 U/L (ref 15–41)
Albumin: 2.4 g/dL — ABNORMAL LOW (ref 3.5–5.0)
Alkaline Phosphatase: 93 U/L (ref 38–126)
Anion gap: 12 (ref 5–15)
BUN: 45 mg/dL — AB (ref 6–20)
CHLORIDE: 114 mmol/L — AB (ref 101–111)
CO2: 23 mmol/L (ref 22–32)
CREATININE: 0.87 mg/dL (ref 0.61–1.24)
Calcium: 8.5 mg/dL — ABNORMAL LOW (ref 8.9–10.3)
Glucose, Bld: 122 mg/dL — ABNORMAL HIGH (ref 65–99)
POTASSIUM: 3 mmol/L — AB (ref 3.5–5.1)
SODIUM: 149 mmol/L — AB (ref 135–145)
Total Bilirubin: 1.1 mg/dL (ref 0.3–1.2)
Total Protein: 6.2 g/dL — ABNORMAL LOW (ref 6.5–8.1)

## 2017-05-29 LAB — CBC WITH DIFFERENTIAL/PLATELET
Basophils Absolute: 0 10*3/uL (ref 0–0.1)
Basophils Relative: 0 %
EOS ABS: 0 10*3/uL (ref 0–0.7)
EOS PCT: 0 %
HCT: 34.7 % — ABNORMAL LOW (ref 40.0–52.0)
HEMOGLOBIN: 11.2 g/dL — AB (ref 13.0–18.0)
LYMPHS ABS: 1.3 10*3/uL (ref 1.0–3.6)
LYMPHS PCT: 6 %
MCH: 27.7 pg (ref 26.0–34.0)
MCHC: 32.1 g/dL (ref 32.0–36.0)
MCV: 86.1 fL (ref 80.0–100.0)
MONOS PCT: 7 %
Monocytes Absolute: 1.8 10*3/uL — ABNORMAL HIGH (ref 0.2–1.0)
Neutro Abs: 21.2 10*3/uL — ABNORMAL HIGH (ref 1.4–6.5)
Neutrophils Relative %: 87 %
PLATELETS: 278 10*3/uL (ref 150–440)
RBC: 4.03 MIL/uL — ABNORMAL LOW (ref 4.40–5.90)
RDW: 21.6 % — ABNORMAL HIGH (ref 11.5–14.5)
WBC: 24.3 10*3/uL — ABNORMAL HIGH (ref 3.8–10.6)

## 2017-05-29 LAB — URINALYSIS, COMPLETE (UACMP) WITH MICROSCOPIC
BILIRUBIN URINE: NEGATIVE
Bacteria, UA: NONE SEEN
Glucose, UA: NEGATIVE mg/dL
KETONES UR: NEGATIVE mg/dL
Leukocytes, UA: NEGATIVE
Nitrite: NEGATIVE
PH: 5 (ref 5.0–8.0)
PROTEIN: 30 mg/dL — AB
SQUAMOUS EPITHELIAL / LPF: NONE SEEN
Specific Gravity, Urine: 1.024 (ref 1.005–1.030)

## 2017-05-29 LAB — LACTIC ACID, PLASMA
LACTIC ACID, VENOUS: 3.1 mmol/L — AB (ref 0.5–1.9)
LACTIC ACID, VENOUS: 3.6 mmol/L — AB (ref 0.5–1.9)

## 2017-05-29 MED ORDER — LORAZEPAM 0.5 MG PO TABS: 0.5000 mg | ORAL_TABLET | Freq: Four times a day (QID) | ORAL | Status: DC | PRN

## 2017-05-29 MED ORDER — SODIUM CHLORIDE 0.9% FLUSH
10.0000 mL | INTRAVENOUS | Status: DC | PRN
  Administered 2017-05-29: 16:00:00 10 mL via INTRAVENOUS
  Administered 2017-05-30: 3 mL via INTRAVENOUS
  Filled 2017-05-29 (×2): qty 10

## 2017-05-29 MED ORDER — IOPAMIDOL (ISOVUE-370) INJECTION 76%
75.0000 mL | Freq: Once | INTRAVENOUS | Status: AC | PRN
  Administered 2017-05-29: 75 mL via INTRAVENOUS

## 2017-05-29 MED ORDER — PROCHLORPERAZINE MALEATE 10 MG PO TABS
10.0000 mg | ORAL_TABLET | Freq: Four times a day (QID) | ORAL | Status: DC | PRN
  Filled 2017-05-29: qty 1

## 2017-05-29 MED ORDER — ONDANSETRON HCL 4 MG PO TABS: 8.0000 mg | ORAL_TABLET | Freq: Two times a day (BID) | ORAL | Status: DC | PRN

## 2017-05-29 MED ORDER — LEUPROLIDE ACETATE (6 MONTH) 45 MG IM KIT: 45.0000 mg | PACK | INTRAMUSCULAR | Status: DC

## 2017-05-29 MED ORDER — FLUCONAZOLE 100 MG PO TABS: 100.0000 mg | ORAL_TABLET | ORAL | Status: DC

## 2017-05-29 MED ORDER — ATORVASTATIN CALCIUM 20 MG PO TABS
40.0000 mg | ORAL_TABLET | Freq: Every day | ORAL | Status: DC
  Administered 2017-05-29: 21:00:00 40 mg via ORAL
  Filled 2017-05-29 (×2): qty 2

## 2017-05-29 MED ORDER — CYCLOBENZAPRINE HCL 10 MG PO TABS
5.0000 mg | ORAL_TABLET | Freq: Three times a day (TID) | ORAL | Status: DC
  Administered 2017-05-29 – 2017-05-31 (×7): 5 mg via ORAL
  Filled 2017-05-29 (×8): qty 1

## 2017-05-29 MED ORDER — LISINOPRIL 5 MG PO TABS
5.0000 mg | ORAL_TABLET | Freq: Every day | ORAL | Status: DC
  Administered 2017-05-29 – 2017-05-30 (×2): 5 mg via ORAL
  Filled 2017-05-29 (×2): qty 1

## 2017-05-29 MED ORDER — MIRTAZAPINE 15 MG PO TABS
15.0000 mg | ORAL_TABLET | Freq: Every day | ORAL | Status: DC
  Administered 2017-05-29 – 2017-05-31 (×2): 15 mg via ORAL
  Filled 2017-05-29 (×3): qty 1

## 2017-05-29 MED ORDER — ENOXAPARIN SODIUM 80 MG/0.8ML ~~LOC~~ SOLN
65.0000 mg | Freq: Two times a day (BID) | SUBCUTANEOUS | Status: DC
  Administered 2017-05-29 – 2017-05-30 (×3): 65 mg via SUBCUTANEOUS
  Filled 2017-05-29 (×3): qty 0.8

## 2017-05-29 MED ORDER — ONDANSETRON HCL 4 MG/2ML IJ SOLN: 4.0000 mg | Freq: Four times a day (QID) | INTRAMUSCULAR | Status: DC | PRN

## 2017-05-29 MED ORDER — AZITHROMYCIN 500 MG PO TABS
500.0000 mg | ORAL_TABLET | Freq: Once | ORAL | Status: AC
  Administered 2017-05-29: 500 mg via ORAL
  Filled 2017-05-29: qty 1

## 2017-05-29 MED ORDER — DEXTROSE 5 % IV SOLN
1.0000 g | Freq: Once | INTRAVENOUS | Status: AC
  Administered 2017-05-29: 1 g via INTRAVENOUS
  Filled 2017-05-29: qty 10

## 2017-05-29 MED ORDER — HYDROCODONE-ACETAMINOPHEN 7.5-325 MG PO TABS
1.0000 | ORAL_TABLET | Freq: Four times a day (QID) | ORAL | Status: DC | PRN
  Administered 2017-05-29 – 2017-05-31 (×2): 1 via ORAL
  Filled 2017-05-29 (×2): qty 1

## 2017-05-29 MED ORDER — PIPERACILLIN-TAZOBACTAM 3.375 G IVPB
3.3750 g | Freq: Three times a day (TID) | INTRAVENOUS | Status: DC
  Administered 2017-05-29 – 2017-05-31 (×6): 3.375 g via INTRAVENOUS
  Filled 2017-05-29 (×6): qty 50

## 2017-05-29 MED ORDER — ACETAMINOPHEN 650 MG RE SUPP: 650.0000 mg | Freq: Four times a day (QID) | RECTAL | Status: DC | PRN

## 2017-05-29 MED ORDER — SERTRALINE HCL 50 MG PO TABS
25.0000 mg | ORAL_TABLET | Freq: Every day | ORAL | Status: DC
  Administered 2017-05-29 – 2017-05-30 (×2): 25 mg via ORAL
  Filled 2017-05-29 (×2): qty 1

## 2017-05-29 MED ORDER — DULOXETINE HCL 30 MG PO CPEP
60.0000 mg | ORAL_CAPSULE | Freq: Every day | ORAL | Status: DC
  Administered 2017-05-29 – 2017-05-30 (×2): 60 mg via ORAL
  Filled 2017-05-29 (×2): qty 2

## 2017-05-29 MED ORDER — GABAPENTIN 300 MG PO CAPS
300.0000 mg | ORAL_CAPSULE | Freq: Three times a day (TID) | ORAL | Status: DC
  Administered 2017-05-29 – 2017-05-31 (×6): 300 mg via ORAL
  Filled 2017-05-29 (×7): qty 1

## 2017-05-29 MED ORDER — ENOXAPARIN SODIUM 80 MG/0.8ML ~~LOC~~ SOLN
1.0000 mg/kg | Freq: Once | SUBCUTANEOUS | Status: AC
  Administered 2017-05-29: 12:00:00 65 mg via SUBCUTANEOUS
  Filled 2017-05-29: qty 0.8

## 2017-05-29 MED ORDER — MEGESTROL ACETATE 400 MG/10ML PO SUSP
400.0000 mg | Freq: Two times a day (BID) | ORAL | Status: DC
  Administered 2017-05-29 – 2017-06-01 (×4): 400 mg via ORAL
  Filled 2017-05-29 (×6): qty 10

## 2017-05-29 MED ORDER — ORAL CARE MOUTH RINSE
15.0000 mL | Freq: Two times a day (BID) | OROMUCOSAL | Status: DC
  Administered 2017-05-29 – 2017-06-01 (×6): 15 mL via OROMUCOSAL

## 2017-05-29 MED ORDER — ACETAMINOPHEN 325 MG PO TABS: 650.0000 mg | ORAL_TABLET | Freq: Four times a day (QID) | ORAL | Status: DC | PRN

## 2017-05-29 MED ORDER — POTASSIUM CHLORIDE IN NACL 20-0.9 MEQ/L-% IV SOLN
INTRAVENOUS | Status: DC
  Administered 2017-05-29 – 2017-05-30 (×2): via INTRAVENOUS
  Filled 2017-05-29 (×2): qty 1000

## 2017-05-29 MED ORDER — ASPIRIN EC 81 MG PO TBEC
81.0000 mg | DELAYED_RELEASE_TABLET | Freq: Every day | ORAL | Status: DC
  Administered 2017-05-29 – 2017-05-30 (×2): 81 mg via ORAL
  Filled 2017-05-29 (×2): qty 1

## 2017-05-29 MED ORDER — ONDANSETRON HCL 4 MG PO TABS
4.0000 mg | ORAL_TABLET | Freq: Four times a day (QID) | ORAL | Status: DC | PRN
Start: 2017-05-29 — End: 2017-06-01

## 2017-05-29 MED ORDER — SODIUM CHLORIDE 0.9 % IV BOLUS (SEPSIS)
30.0000 mL/kg | Freq: Once | INTRAVENOUS | Status: AC
  Administered 2017-05-29: 1893 mL via INTRAVENOUS

## 2017-05-29 MED ORDER — VANCOMYCIN HCL IN DEXTROSE 1-5 GM/200ML-% IV SOLN
1000.0000 mg | Freq: Once | INTRAVENOUS | Status: AC
  Administered 2017-05-29: 1000 mg via INTRAVENOUS
  Filled 2017-05-29: qty 200

## 2017-05-29 MED ORDER — VANCOMYCIN HCL IN DEXTROSE 1-5 GM/200ML-% IV SOLN
1000.0000 mg | Freq: Two times a day (BID) | INTRAVENOUS | Status: DC
  Administered 2017-05-29 – 2017-05-31 (×4): 1000 mg via INTRAVENOUS
  Filled 2017-05-29 (×6): qty 200

## 2017-05-29 NOTE — Progress Notes (Signed)
Pharmacy Antibiotic Note  Francisco Lowe is a 71 y.o. male admitted on 05/29/2017 with pneumonia.  Pharmacy has been consulted for Zosyn and vancomycin dosing.  Plan: 1. Zosyn 3.375 gm IV Q8H EI 2. Vancomycin 1 gm IV x 1 followed in 6 hours (stacked dosing) by vancomycin 1 gm IV Q12H, predicted trough 19 mcg/mL.   Vd 49.3 L, Ke 0.061 hr-1, T1/2 11.3 hr  Height: 5\' 11"  (180.3 cm) Weight: 139 lb (63 kg) IBW/kg (Calculated) : 75.3  Temp (24hrs), Avg:97.5 F (36.4 C), Min:97.5 F (36.4 C), Max:97.5 F (36.4 C)   Recent Labs Lab 05/29/17 0945  WBC 24.3*  CREATININE 0.87  LATICACIDVEN 3.1*    Estimated Creatinine Clearance: 68.5 mL/min (by C-G formula based on SCr of 0.87 mg/dL).    No Known Allergies  Thank you for allowing pharmacy to be a part of this patient's care.  Laural Benes, Pharm.D., BCPS Clinical Pharmacist 05/29/2017 2:04 PM

## 2017-05-29 NOTE — Plan of Care (Signed)
Problem: Consults Goal: Diagnosis - Venous Thromboembolism (VTE) Choose a selection  Outcome: Progressing Pt has bilateral PE's with  +DVT in right leg via [place]

## 2017-05-29 NOTE — ED Provider Notes (Signed)
[facility] Emergency Department Provider Note  ____________________________________________   First MD Initiated Contact with Patient 05/29/17 [phone]     (approximate)  I have reviewed the triage vital signs and the nursing notes.   HISTORY  Chief Complaint Shortness of Breath   HPI Francisco Lowe is a 71 y.o. male who comes to the emergency department via EMS with several days of productive cough. Afebrile. He reports significant shortness of breath. He has a history of stage IV prostate cancer has received unknown treatments but last received his treatments 2 weeks ago. He's had no hemoptysis. No unintentional weight loss. He has noted right lower extremity swelling recently.He has moderate severity sharp upper chest pain worse with taking a deep breath improved with rest.   Past Medical History:  Diagnosis Date  . Arthritis   . Hepatitis C   . Hyperlipidemia   . Hypertension   . Prostate cancer ([place]) 12/2016  . Stroke [facility])    w left sided weakness per pt (2017)    Patient Active Problem List   Diagnosis Date Noted  . Bilateral pulmonary embolism ([place]) 05/29/2017  . Pressure injury of skin 05/29/2017  . Chemotherapy-induced neuropathy ([place]) 03/04/2017  . Prostate cancer metastatic to bone ([place]) 01/08/2017  . Goals of care, counseling/discussion 01/08/2017  . Chronic low back pain 12/07/2016  . Erectile dysfunction 12/07/2016  . Essential hypertension 12/07/2016  . Hepatitis C 12/07/2016  . Medicare annual wellness visit, initial 06/19/2016  . Status post CVA 02/17/2016  . HTN (hypertension) 12/20/2015  . Stroke ([place]) 12/20/2015  . DDD (degenerative disc disease), lumbar 06/19/2014  . Left lumbar radiculitis 06/18/2014  . Diverticulosis 06/11/2004    Past Surgical History:  Procedure Laterality Date  . PORTA CATH INSERTION N/A 01/12/2017   Procedure: Glori Luis Cath Insertion;  Surgeon: Katha Cabal, MD;  Location: [place] [facility];   Service: Cardiovascular;  Laterality: N/A;    Prior to Admission medications   Medication Sig Start Date End Date Taking? Authorizing Provider  aspirin EC 81 MG tablet Take 81 mg by mouth daily.    Yes [provider]  atorvastatin (LIPITOR) 40 MG tablet Take 40 mg by mouth at bedtime.  02/17/16  Yes [provider]  cyclobenzaprine (FLEXERIL) 10 MG tablet TAKE 0.5 TABLET BY MOUTH AT NIGHT AS NEEDED FOR MUSCLE SPASMS 08/12/16  Yes [provider]  DULoxetine (CYMBALTA) 30 MG capsule Take 1 capsule (30 mg total) by mouth as directed. Take 1 capsule daily x 1 week and then 2 capsules daily after that Patient taking differently: Take 60 mg by mouth daily.  03/25/17  Yes Sindy Guadeloupe, MD  gabapentin (NEURONTIN) 300 MG capsule Take 1 capsule (300 mg total) by mouth 3 (three) times daily. 03/04/17  Yes Sindy Guadeloupe, MD  HYDROcodone-acetaminophen (NORCO) 7.5-325 MG tablet Take 1 tablet by mouth every 6 (six) hours as needed for moderate pain.  08/15/16  Yes [provider]  lidocaine-prilocaine (EMLA) cream Apply to affected area once 01/08/17  Yes Sindy Guadeloupe, MD  lisinopril (PRINIVIL,ZESTRIL) 5 MG tablet Take 5 mg by mouth daily.  12/01/16  Yes [provider]  megestrol (MEGACE) 400 MG/10ML suspension Take 10 mLs (400 mg total) by mouth 2 (two) times daily. 04/15/17  Yes Sindy Guadeloupe, MD  mirtazapine (REMERON) 15 MG tablet Take 15 mg by mouth at bedtime.  05/19/16  Yes [provider]  ondansetron (ZOFRAN) 8 MG tablet Take 1 tablet (  8 mg total) by mouth 2 (two) times daily as needed for refractory nausea / vomiting. 01/08/17  Yes Sindy Guadeloupe, MD  prochlorperazine (COMPAZINE) 10 MG tablet Take 1 tablet (10 mg total) by mouth every 6 (six) hours as needed (Nausea or vomiting). 01/08/17  Yes Sindy Guadeloupe, MD  sertraline (ZOLOFT) 25 MG tablet Take 25 mg by mouth. 02/25/16  Yes [provider]  fluconazole (DIFLUCAN) 100 MG tablet Take 1 tablet  (100 mg total) by mouth as directed. Take 2 tablets on first day and then 1 tablet a day for 7 more days Patient not taking: Reported on 04/15/2017 01/28/17   Sindy Guadeloupe, MD    Allergies Patient has no known allergies.  Family History  Problem Relation Age of Onset  . Cancer Sister   . Prostatitis Brother   . Prostatitis Brother   . Prostate cancer Neg Hx   . Bladder Cancer Neg Hx   . Kidney cancer Neg Hx     Social History Social History  Substance Use Topics  . Smoking status: Former Smoker    Packs/day: 0.25    Years: 20.00    Types: Cigarettes    Quit date: 01/07/1997  . Smokeless tobacco: Never Used  . Alcohol use No    Review of Systems Constitutional: No fever/chills Eyes: No visual changes. ENT: No sore throat. Cardiovascular: positive chest pain. Respiratory: positive shortness of breath. Gastrointestinal: No abdominal pain.  No nausea, no vomiting.  No diarrhea.  No constipation. Genitourinary: Negative for dysuria. Musculoskeletal: Negative for back pain. Skin: Negative for rash. Neurological: Negative for headaches, focal weakness or numbness.   ____________________________________________   PHYSICAL EXAM:  VITAL SIGNS: ED Triage Vitals [05/29/17 0940]  Enc Vitals Group     BP      Pulse      Resp      Temp      Temp src      SpO2      Weight 139 lb (63 kg)     Height 5\' 11"  (1.803 m)     Head Circumference      Peak Flow      Pain Score 5     Pain Loc      Pain Edu?      Excl. in [place]?     Constitutional: alert and oriented 4 chronically ill-appearing cachectic clearly short of breath speaking in short sentences Eyes: PERRL EOMI. Head: Atraumatic. Nose: No congestion/rhinnorhea. Mouth/Throat: No trismus Neck: No stridor.   Cardiovascular: achycardicrate, regular rhythm. Grossly normal heart sounds.  Good peripheral circulation. Respiratory: increased respiratory effort using mild accessory muscles lungs are clear and moving good  air Gastrointestinal: oft nontender Musculoskeletal: right lower extremity 2+ pitting edema left lower extremity normal  Neurologic:  Normal speech and language. No gross focal neurologic deficits are appreciated. Skin:  Skin is warm, dry and intact. No rash noted. Psychiatric: Mood and affect are normal. Speech and behavior are normal.    ____________________________________________   DIFFERENTIAL includes but not limited to  Pulmonary bruising, pneumonia, aortic dissection, acute coronary syndrome ____________________________________________   LABS (all labs ordered are listed, but only abnormal results are displayed)  Labs Reviewed  LACTIC ACID, PLASMA - Abnormal; Notable for the following:       Result Value   Lactic Acid, Venous 3.1 (*)    All other components within normal limits  LACTIC ACID, PLASMA - Abnormal; Notable for the following:    Lactic Acid, Venous  3.6 (*)    All other components within normal limits  COMPREHENSIVE METABOLIC PANEL - Abnormal; Notable for the following:    Sodium 149 (*)    Potassium 3.0 (*)    Chloride 114 (*)    Glucose, Bld 122 (*)    BUN 45 (*)    Calcium 8.5 (*)    Total Protein 6.2 (*)    Albumin 2.4 (*)    All other components within normal limits  CBC WITH DIFFERENTIAL/PLATELET - Abnormal; Notable for the following:    WBC 24.3 (*)    RBC 4.03 (*)    Hemoglobin 11.2 (*)    HCT 34.7 (*)    RDW 21.6 (*)    Neutro Abs 21.2 (*)    Monocytes Absolute 1.8 (*)    All other components within normal limits  URINALYSIS, COMPLETE (UACMP) WITH MICROSCOPIC - Abnormal; Notable for the following:    Color, Urine YELLOW (*)    APPearance HAZY (*)    Hgb urine dipstick SMALL (*)    Protein, ur 30 (*)    All other components within normal limits  BASIC METABOLIC PANEL - Abnormal; Notable for the following:    Potassium 3.1 (*)    Chloride 114 (*)    CO2 21 (*)    Glucose, Bld 124 (*)    BUN 27 (*)    Calcium 7.9 (*)    All other  components within normal limits  CBC - Abnormal; Notable for the following:    WBC 28.9 (*)    RBC 3.69 (*)    Hemoglobin 10.3 (*)    HCT 32.2 (*)    RDW 21.4 (*)    All other components within normal limits  CULTURE, BLOOD (ROUTINE X 2)  CULTURE, BLOOD (ROUTINE X 2)  MRSA PCR SCREENING  VANCOMYCIN, TROUGH    Elevated lactate and white count concerning for infection __________________________________________  EKG  ____________________________________________  RADIOLOGY  CT scan with bilateral pulmonary emboli ____________________________________________   PROCEDURES  Procedure(s) performed: no  Procedures  Critical Care performed: yes  CRITICAL CARE Performed by: Darel Hong   Total critical care time: 35 minutes  Critical care time was exclusive of separately billable procedures and treating other patients.  Critical care was necessary to treat or prevent imminent or life-threatening deterioration.  Critical care was time spent personally by me on the following activities: development of treatment plan with patient and/or surrogate as well as nursing, discussions with consultants, evaluation of patient's response to treatment, examination of patient, obtaining history from patient or surrogate, ordering and performing treatments and interventions, ordering and review of laboratory studies, ordering and review of radiographic studies, pulse oximetry and re-evaluation of patient's condition.   Observation: no ____________________________________________   INITIAL IMPRESSION / ASSESSMENT AND PLAN / ED COURSE  Pertinent labs & imaging results that were available during my care of the patient were reviewed by me and considered in my medical decision making (see chart for details).  The patient has stage IV cancer with shortness of breath, afebrile, clear lungs, and a newly swollen right leg. CT angio is pending.     ----------------------------------------- 10:47 AM on 05/29/2017 -----------------------------------------  The patient's lactate came back at 3.1. While he is afebrile and has clear lungs see does have somewhat of a productive cough and I don't have a clear etiology of his shortness of breath. We'll cover him for community-acquired pneumonia as well as give fluids now. 30 cc/kg.  ____________________________________________  ----------------------------------------- 10:51  AM on 05/29/2017 -----------------------------------------  By my read the patient's CT scan shows bilateral pulmonary emboli. He will require inpatient admission given his elevated lactate and respiratory status.  FINAL CLINICAL IMPRESSION(S) / ED DIAGNOSES  Final diagnoses:  Other pulmonary embolism without acute cor pulmonale, unspecified chronicity (HCC)  Prostate cancer (HCC)  Shortness of breath      NEW MEDICATIONS STARTED DURING THIS VISIT:  Current Discharge Medication List       Note:  This document was prepared using Dragon voice recognition software and may include unintentional dictation errors.     Darel Hong, MD 05/30/17 [phone]

## 2017-05-29 NOTE — ED Notes (Signed)
ED Provider at bedside. 

## 2017-05-29 NOTE — Progress Notes (Signed)
Pt brought to floor for admission with care started with [place] calling for pt with pt transported to [place]. Lactic acid 3.6 critical value with page to Dr. Margaretmary Eddy sent.

## 2017-05-29 NOTE — ED Notes (Signed)
Spoke with Dr. Margaretmary Eddy, will address code status

## 2017-05-29 NOTE — Progress Notes (Signed)
ANTICOAGULATION CONSULT NOTE - Initial Consult  Pharmacy Consult for enoxaparin  Indication: pulmonary embolus  No Known Allergies  Patient Measurements: Height: 5\' 11"  (180.3 cm) Weight: 139 lb (63 kg) IBW/kg (Calculated) : 75.3  Vital Signs: Temp: 97.5 F (36.4 C) (08/25 0951) Temp Source: Rectal (08/25 0951) BP: 157/87 (08/25 1214) Pulse Rate: 115 (08/25 1214)  Labs:  Recent Labs  05/29/17 0945  HGB 11.2*  HCT 34.7*  PLT 278  CREATININE 0.87    Estimated Creatinine Clearance: 68.5 mL/min (by C-G formula based on SCr of 0.87 mg/dL).   Medical History: Past Medical History:  Diagnosis Date  . Arthritis   . Hepatitis C   . Hyperlipidemia   . Hypertension   . Prostate cancer ([place]) 12/2016  . Stroke [facility])    w left sided weakness per pt (2017)    Assessment: 71 year old male initiated on therapeutic enoxaparin for bilateral pulmonary embolism. Per provider note, patient will continue on enoxaparin and then will be switched to a NOAC at time of discharge.   Goal of Therapy:  Monitor platelets by anticoagulation protocol: Yes   Plan:  Enoxaparin 65 mg SQ twice daily  (CrCl 68.5 ml/min)    [place] Resident  05/29/2017,12:41 PM

## 2017-05-29 NOTE — Progress Notes (Signed)
[place] received an OR to report to PT's roo   05/29/17 1550  Clinical Encounter Type  Visited With Patient and family together  Visit Type Initial  Referral From Nurse  Consult/Referral To Chaplain  Spiritual Encounters  Spiritual Needs Prayer;Emotional  m for prayer. [place] went to PT's room and prayed.

## 2017-05-29 NOTE — Clinical Social Work Note (Signed)
CSW received consult for possible placement. CSW will follow pending PT recommendations and family wishes as home with hospice is an option.  Santiago Bumpers, MSW, Latanya Presser [phone]

## 2017-05-29 NOTE — Progress Notes (Signed)
Pt's care/treatment carried up asap upon return to floor from [place]. Dgt in and wife in to visit. DOE with mild SOB on exertion even speaking without distress. Paged Dr. Margaretmary Eddy with return TC from MD acknowledging with +US report for DVT right leg with no new orders. 02 continued with IV antibiotics given for pneumonia.

## 2017-05-29 NOTE — ED Triage Notes (Signed)
Pt arrived via EMS from home with reports of shortness of breath for several days, pt has hx of prostate CA and recently stopped treatment 2 weeks ago.  Per EMS on arrival pt's sats were in the 70s on RA, pt brought up to 90s once he was placed on 15L NRB.  Pt was also tachycardic.

## 2017-05-29 NOTE — H&P (Signed)
[facility] [place] NAME: Francisco Lowe    MR#:  [id number]  DATE OF BIRTH:  01-15-1946  DATE OF ADMISSION:  05/29/2017  PRIMARY CARE PHYSICIAN: Dion Body, MD   REQUESTING/REFERRING PHYSICIAN: Rifenbark  CHIEF COMPLAINT:   sob HISTORY OF PRESENT ILLNESS:  Francisco Lowe  is a 71 y.o. 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 Patient has metastatic prostate cancer and has just completed chemotherapy. Patient denies any chest pain but reporting shortness of breath with minimal exertion. Right lower extremity is swollen but denies any redness or trauma. Patient's sister, daughter and other significant family members are at bedside  PAST MEDICAL HISTORY:   Past Medical History:  Diagnosis Date  . Arthritis   . Hepatitis C   . Hyperlipidemia   . Hypertension   . Prostate cancer ([place]) 12/2016  . Stroke [facility])    w left sided weakness per pt (2017)    PAST SURGICAL HISTOIRY:   Past Surgical History:  Procedure Laterality Date  . PORTA CATH INSERTION N/A 01/12/2017   Procedure: Glori Luis Cath Insertion;  Surgeon: Katha Cabal, MD;  Location: [facility];  Service: Cardiovascular;  Laterality: N/A;    SOCIAL HISTORY:   Social History  Substance Use Topics  . Smoking status: Former Smoker    Packs/day: 0.25    Years: 20.00    Types: Cigarettes    Quit date: 01/07/1997  . Smokeless tobacco: Never Used  . Alcohol use No    FAMILY HISTORY:   Family History  Problem Relation Age of Onset  . Cancer Sister   . Prostatitis Brother   . Prostatitis Brother   . Prostate cancer Neg Hx   . Bladder Cancer Neg Hx   . Kidney cancer Neg Hx     DRUG ALLERGIES:   No Known Allergies  REVIEW OF SYSTEMS:  CONSTITUTIONAL: No fever, Reporting fatigue  EYES: No blurred or double vision.  EARS, NOSE, AND THROAT: No tinnitus or ear pain.  RESPIRATORY: Reporting some cough, worsening of shortness of breath, denies wheezing or hemoptysis.  CARDIOVASCULAR: No chest pain, orthopnea, edema.  GASTROINTESTINAL: No nausea, vomiting, diarrhea or abdominal pain.  GENITOURINARY: No dysuria, hematuria.  ENDOCRINE: No polyuria, nocturia,  HEMATOLOGY: No anemia, easy bruising or bleeding SKIN: No rash or lesion. MUSCULOSKELETAL: Right leg is swollen No joint pain or arthritis.   NEUROLOGIC: No tingling, numbness, weakness.  PSYCHIATRY: No anxiety or depression.   MEDICATIONS AT HOME:   Prior to Admission medications   Medication Sig Start Date End Date Taking? Authorizing Provider  aspirin EC 81 MG tablet Take 81 mg by mouth.    [provider]  atorvastatin (LIPITOR) 40 MG tablet Take 40 mg by mouth at bedtime.  02/17/16   [provider]  cyclobenzaprine (FLEXERIL) 10 MG tablet TAKE ONE TABLET BY MOUTH AT NIGHT AS NEEDED FOR MUSCLE SPASMS 08/12/16   [provider]  dexamethasone (DECADRON) 4 MG tablet TAKE 2 TABLETS BY MOUTH TWICE DAILY *START THE DAY BEFORE TAXOTERE, THEN DAILY AFTER CHEMO FOR 2 DAYS* 03/25/17   Sindy Guadeloupe, MD  dexamethasone (DECADRON) 4 MG tablet TAKE 2 TABLETS BY MOUTH TWICE DAILY *START THE DAY BEFORE TAXOTERE, THEN  DAILY AFTER CHEMO FOR 2 DAYS* 04/20/17   Sindy Guadeloupe, MD  DULoxetine (CYMBALTA) 30 MG capsule Take 1 capsule (30 mg total) by mouth as directed. Take 1 capsule daily x 1 week and then 2 capsules daily after that 03/25/17   Sindy Guadeloupe, MD  fluconazole (DIFLUCAN) 100 MG tablet Take 1 tablet (100 mg total) by mouth as directed. Take 2 tablets on first day and then 1 tablet a day for 7 more days Patient not taking: Reported on 04/15/2017 01/28/17   Sindy Guadeloupe, MD  gabapentin (NEURONTIN) 300 MG  capsule Take 1 capsule (300 mg total) by mouth 3 (three) times daily. 03/04/17   Sindy Guadeloupe, MD  HYDROcodone-acetaminophen (NORCO) 7.5-325 MG tablet Take 1 tablet by mouth every 6 (six) hours as needed for moderate pain.  08/15/16   [provider]  Leuprolide Acetate, 6 Month, (LUPRON DEPOT, 66-MONTH,) 45 MG injection Inject 45 mg into the muscle every 6 (six) months.    [provider]  lidocaine-prilocaine (EMLA) cream Apply to affected area once 01/08/17   Sindy Guadeloupe, MD  lisinopril (PRINIVIL,ZESTRIL) 5 MG tablet Take 5 mg by mouth daily.  12/01/16   [provider]  LORazepam (ATIVAN) 0.5 MG tablet Take 1 tablet (0.5 mg total) by mouth every 6 (six) hours as needed (Nausea or vomiting). Patient not taking: Reported on 05/06/2017 01/08/17   Sindy Guadeloupe, MD  megestrol (MEGACE) 400 MG/10ML suspension Take 10 mLs (400 mg total) by mouth 2 (two) times daily. 04/15/17   Sindy Guadeloupe, MD  mirtazapine (REMERON) 15 MG tablet Take 15 mg by mouth at bedtime.  05/19/16   [provider]  ondansetron (ZOFRAN) 8 MG tablet Take 1 tablet (8 mg total) by mouth 2 (two) times daily as needed for refractory nausea / vomiting. 01/08/17   Sindy Guadeloupe, MD  prochlorperazine (COMPAZINE) 10 MG tablet Take 1 tablet (10 mg total) by mouth every 6 (six) hours as needed (Nausea or vomiting). Patient not taking: Reported on 03/25/2017 01/08/17   Sindy Guadeloupe, MD  sertraline (ZOLOFT) 25 MG tablet Take 25 mg by mouth. 02/25/16   [provider]      VITAL SIGNS:  Blood pressure (!) 157/87, pulse (!) 115, temperature (!) 97.5 F (36.4 C), temperature source Rectal, resp. rate (!) 26, height 5\' 11"  (1.803 m), weight 63 kg (139 lb), SpO2 96 %.  PHYSICAL EXAMINATION:  GENERAL:  71 y.o.-year-old patient lying in the bed with no acute distress.  EYES: Pupils equal, round, reactive to light and accommodation. No scleral icterus. Extraocular muscles intact.  HEENT: Head atraumatic,  normocephalic. Oropharynx and nasopharynx clear.  NECK:  Supple, no jugular venous distention. No thyroid enlargement, no tenderness.  LUNGS:  breatDiminishedh sounds bilaterally, no wheezing, rales,rhonchi. Bilateral diffuse  crepitation. No use of accessory muscles of respiration.  CARDIOVASCULAR: S1, S2 normal. No murmurs, rubs, or gallops.  ABDOMEN: Soft, nontender, nondistended. Bowel sounds present. No organomegaly or mass.  EXTREMITIES: No pedal edema, cyanosis, or clubbing.  Port-A-Cath intact  NEUROLOGIC: Cranial nerves II through XII are intact. Muscle strength 5/5 in all extremities except left lower extremity 4 out of 5 . Sensation intact. Gait not checked.  PSYCHIATRIC: The patient is alert and oriented x 3.  SKIN: No obvious rash, lesion, or ulcer.   LABORATORY PANEL:   CBC  Recent Labs Lab 05/29/17 0945  WBC 24.3*  HGB 11.2*  HCT 34.7*  PLT 278   ------------------------------------------------------------------------------------------------------------------  Chemistries   Recent Labs Lab 05/29/17 0945  NA 149*  K 3.0*  CL 114*  CO2 23  GLUCOSE 122*  BUN 45*  CREATININE 0.87  CALCIUM 8.5*  AST 32  ALT 22  ALKPHOS 93  BILITOT 1.1   ------------------------------------------------------------------------------------------------------------------  Cardiac Enzymes No results for input(s): TROPONINI in the last 168 hours. ------------------------------------------------------------------------------------------------------------------  RADIOLOGY:  Dg Chest 2 View  Result Date: 05/29/2017 CLINICAL DATA:  Shortness of breath for several days. Hypoxia. Tachycardia. Prostate carcinoma. EXAM: CHEST  2 VIEW COMPARISON:  Chest CT on 01/13/2017 FINDINGS: The heart size and mediastinal contours are within normal limits. Right-sided power port is seen in appropriate position. Diffuse pulmonary interstitial infiltrates are seen which are new compared to previous CT.  No evidence of pulmonary consolidation or pleural effusion. IMPRESSION: Diffuse pulmonary interstitial infiltrates. Differential diagnosis includes pulmonary edema, atypical infection, and drug reaction. Electronically Signed   By: Earle Gell M.D.   On: 05/29/2017 11:37   Ct Angio Chest Pe W/cm &/or Wo Cm  Result Date: 05/29/2017 CLINICAL DATA:  Shortness of breath, prostate cancer, on chemotherapy EXAM: CT ANGIOGRAPHY CHEST WITH CONTRAST TECHNIQUE: Multidetector CT imaging of the chest was performed using the standard protocol during bolus administration of intravenous contrast. Multiplanar CT image reconstructions and MIPs were obtained to evaluate the vascular anatomy. CONTRAST:  75 mL Isovue 370 IV COMPARISON:  Chest radiographs dated 05/29/2017. CT chest dated 01/13/2017. FINDINGS: Cardiovascular: Satisfactory opacification the bilateral pulmonary artery to the segmental level. Evaluation is positive for bilateral pulmonary emboli, lower lobe predominant. Examples include segmental right upper lobe (series 5/image 99), distal left main at the bifurcation of the lingula (series 5/image 154), distal lobar right lower lobe (series 5/image 193), and segmental left lower lobe (series 5/ image 199). No findings to suggest right heart strain.  RV to LV ratio 0.72. The heart is normal in size.  No pericardial effusion. No evidence of thoracic aortic aneurysm. Mild atherosclerotic calcifications of the aortic arch. Right chest port terminates at the cavoatrial junction. Mediastinum/Nodes: No suspicious mediastinal lymphadenopathy. Visualized thyroid is unremarkable. Lungs/Pleura: Multifocal patchy/ ground-glass opacities in the lungs bilaterally, with a predominant peripheral distribution and relative sparing at the lung apices. This appearance is new from April 2018 and does not have a typical appearance for interstitial edema or metastatic disease. As such, multifocal infection/pneumonia is favored. No suspicious  pulmonary nodules. No pleural effusion or pneumothorax. Upper Abdomen: Visualized upper abdomen is notable for cholelithiasis, without associated inflammatory changes. Musculoskeletal: Diffuse/widespread osseous metastases, grossly unchanged. Review of the MIP images confirms the above findings. IMPRESSION: Bilateral pulmonary emboli, lobar/segmental, lower lobe predominant. Overall clot burden is moderate. No evidence of right heart strain. Multifocal patchy/ground-glass opacities in the lungs bilaterally, favoring multifocal infection/pneumonia. Diffuse/widespread osseous metastases, grossly unchanged. Critical Value/emergent results were called by telephone at the time of interpretation on 05/29/2017 at 11:44 am to Dr. Darel Hong , who verbally acknowledged these results. Aortic Atherosclerosis (ICD10-I70.0). Electronically Signed   By: Julian Hy M.D.   On: 05/29/2017 11:53    EKG:  No orders found for this or any previous visit.  IMPRESSION AND PLAN:   #Acute respiratory distress secondary to bilateral pulmonary embolism and multifocal pneumonia Admit to MedSurg unit with telemetry Lovenox repeat dose and broad-spectrum antibiotics Oxygen via nasal cannula and wean off as tolerated  #Sepsis secondary to multifocal pneumonia Patient meets criteria at the time of admission for sepsis with elevated lactic acid, leukocytosis and tachycardia Patient has received IV  Rocephin and azithromycin in the ED, will provide broad-spectrum coverage with Zosyn and vancomycin as patient is immunocompromised   IV fluids and supportive treatment follow-up on the lactic acid level  #Bilateral pulmonary embolism CT chest with no right heart strain Patient is started on therapeutic dose of Lovenox, this can be switched to NOAC-Xarelto or eliquis at the time of discharge Bilateral lower extremity venous Dopplers are ordered to rule out DVT  #Hypokalemia replete and monitor  #Stage IV prostate cancer  with bone metastases Patient just completed chemotherapy and follow-up appointment with Dr. Janese Banks is Thursday Attending physician can consult oncology as needed Continue home medications Remeron and Megace Palliative care consulted to discuss goals of care  #Hypertension continue home medication lisinopril    All the records are reviewed and case discussed with ED provider. Management plans discussed with the patient, family and they are in agreement.  CODE STATUS: Full code, wife is the healthcare power of attorney  TOTAL CRITICAL CARE TIME TAKING CARE OF THIS PATIENT: 45 minutes.   Note: This dictation was prepared with Dragon dictation along with smaller phrase technology. Any transcriptional errors that result from this process are unintentional.  Nicholes Mango M.D on 05/29/2017 at 12:28 PM  Between 7am to 6pm - Pager - [phone]  After 6pm go to [email or web address] - password [email or web address] [facility]  Office  [phone]  CC: Primary care physician; Dion Body, MD

## 2017-05-30 ENCOUNTER — Inpatient Hospital Stay: Payer: Medicare Other

## 2017-05-30 ENCOUNTER — Encounter: Payer: Self-pay | Admitting: Internal Medicine

## 2017-05-30 DIAGNOSIS — J9601 Acute respiratory failure with hypoxia: Secondary | ICD-10-CM

## 2017-05-30 LAB — BASIC METABOLIC PANEL
ANION GAP: 7 (ref 5–15)
BUN: 27 mg/dL — AB (ref 6–20)
CHLORIDE: 114 mmol/L — AB (ref 101–111)
CO2: 21 mmol/L — AB (ref 22–32)
Calcium: 7.9 mg/dL — ABNORMAL LOW (ref 8.9–10.3)
Creatinine, Ser: 0.7 mg/dL (ref 0.61–1.24)
GFR calc Af Amer: >60 mL/min (ref >60)
GFR calc non Af Amer: >60 mL/min (ref >60)
GLUCOSE: 124 mg/dL — AB (ref 65–99)
POTASSIUM: 3.1 mmol/L — AB (ref 3.5–5.1)
Sodium: 142 mmol/L (ref 135–145)

## 2017-05-30 LAB — CBC
HCT: 32.2 % — ABNORMAL LOW (ref 40.0–52.0)
Hemoglobin: 10.3 g/dL — ABNORMAL LOW (ref 13.0–18.0)
MCH: 27.9 pg (ref 26.0–34.0)
MCHC: 32 g/dL (ref 32.0–36.0)
MCV: 87.2 fL (ref 80.0–100.0)
Platelets: 276 10*3/uL (ref 150–440)
RBC: 3.69 MIL/uL — ABNORMAL LOW (ref 4.40–5.90)
RDW: 21.4 % — AB (ref 11.5–14.5)
WBC: 28.9 10*3/uL — ABNORMAL HIGH (ref 3.8–10.6)

## 2017-05-30 LAB — MRSA PCR SCREENING: MRSA BY PCR: NEGATIVE

## 2017-05-30 MED ORDER — POTASSIUM CHLORIDE IN NACL 20-0.9 MEQ/L-% IV SOLN
INTRAVENOUS | Status: DC
  Administered 2017-05-30 – 2017-05-31 (×2): via INTRAVENOUS
  Filled 2017-05-30 (×2): qty 1000

## 2017-05-30 MED ORDER — MORPHINE SULFATE 10 MG/5ML PO SOLN: 2.5000 mg | ORAL | Status: DC | PRN

## 2017-05-30 MED ORDER — METHYLPREDNISOLONE SODIUM SUCC 125 MG IJ SOLR
60.0000 mg | Freq: Every day | INTRAMUSCULAR | Status: DC
  Administered 2017-05-30: 10:00:00 60 mg via INTRAVENOUS
  Filled 2017-05-30: qty 2

## 2017-05-30 MED ORDER — ENSURE ENLIVE PO LIQD
237.0000 mL | Freq: Three times a day (TID) | ORAL | Status: DC
  Administered 2017-05-30 – 2017-06-01 (×4): 237 mL via ORAL

## 2017-05-30 NOTE — Plan of Care (Signed)
Problem: Activity: Goal: Risk for activity intolerance will decrease Outcome: Not Progressing Pt more generalized weakness with respirations more shallow/labored with 02 continued. Pt less able to turn self in bed.   Problem: Nutrition: Goal: Adequate nutrition will be maintained Outcome: Not Progressing Less appetite po intake. Supplements started.  Problem: Spiritual Needs Goal: Ability to function at adequate level Outcome: Not Progressing Pt only able to tolerate bedbound care today due to weakness/SOB.  Problem: Activity: Goal: Ability to implement measures to reduce episodes of fatigue will improve Outcome: Not Progressing Resting at long intervals/peaceful/ denies pain.   Problem: Consults Goal: Diagnosis - Venous Thromboembolism (VTE) Choose a selection  Outcome: Completed/Met Date Met: 05/30/17  lovenox therapy continued.   Problem: Discharge Progression Outcomes Goal: Hemodynamically stable Outcome: Not Progressing Rapid response called this morning due to elevated BP, tachycardia, tachynea, "don't feel right" with pulse ox desat to low-mid 80's. Pt responded to NRM with sats returning to normal limits. MD has spoken with pt/family with pt changed to DNR status. Telemetry discontinued. VSS currently . Pt declining.  Goal: Complications resolved/controlled Outcome: Progressing Rapid response/O2 interventions stablized respiratory status this a.m. Currently resting quietly; denies pain/SOB>   Problem: Respiratory: Goal: Respiratory status will improve Outcome: Not Progressing Decline in respiratory status currently controlled with 02 therapy and limited activity.    

## 2017-05-30 NOTE — Progress Notes (Signed)
Pulmonary/critical care  Results of family meeting noted. Patient is presently a DO NOT RESUSCITATE. We'll sign off at this time, please reconsult if  Of any assistance.  Hermelinda Dellen, D.O.

## 2017-05-30 NOTE — Progress Notes (Signed)
Pt overall condition declining by generalized weakness, less animated, respiratory status very limited.  Sleeping at long intervals with decreased appetite. Denies pain/SOB at the current time. Family in and out and aware of prognosis.

## 2017-05-30 NOTE — Progress Notes (Signed)
Francisco Lowe received a PG   05/30/17 0750  Clinical Encounter Type  Visited With Patient  Visit Type Follow-up;Code  Referral From Nurse  Consult/Referral To Chaplain  Spiritual Encounters  Spiritual Needs Prayer;Emotional   for a Rapid response. Ch reported to the room and prayed with PT once he was stable.

## 2017-05-30 NOTE — Progress Notes (Signed)
0745 CNA ask for RN come to room. Pt reporting feeling very tired; denies chest pain/SOB. Tachyneic with shallow respirations with pulse ox down to low- mid 80's on 6l/[place]. Elevated BP, tachycardia. Rapid response called. Applied NRM with 100% given with pulse ox returning to normal with pt reporting feeling better. Rapid Response team present with orders obtained with MD notified. CXR done. Respiratory gave nebs. Pt now stabilized. MD to talk with pt and family about situation.

## 2017-05-30 NOTE — Progress Notes (Addendum)
[facility] NAME: Francisco Lowe    MR#:  [id number]  DATE OF BIRTH:  07-27-1946  SUBJECTIVE:  CHIEF COMPLAINT:   Chief Complaint  Patient presents with  . Shortness of Breath   Patient acutely got worse with his shortness of breath and placed on a nonrebreather. Rapid response was called. Nebulizer given. X-ray shows bilateral interstitial changes with pneumonia.  REVIEW OF SYSTEMS:    Review of Systems  Constitutional: Positive for malaise/fatigue. Negative for chills and fever.  HENT: Negative for sore throat.   Eyes: Negative for blurred vision, double vision and pain.  Respiratory: Positive for cough and shortness of breath. Negative for hemoptysis and wheezing.   Cardiovascular: Negative for chest pain, palpitations, orthopnea and leg swelling.  Gastrointestinal: Negative for abdominal pain, constipation, diarrhea, heartburn, nausea and vomiting.  Genitourinary: Negative for dysuria and hematuria.  Musculoskeletal: Negative for back pain and joint pain.  Skin: Negative for rash.  Neurological: Positive for weakness. Negative for sensory change, speech change, focal weakness and headaches.  Endo/Heme/Allergies: Does not bruise/bleed easily.  Psychiatric/Behavioral: Negative for depression. The patient is not nervous/anxious.     DRUG ALLERGIES:  No Known Allergies  VITALS:  Blood pressure (!) 157/91, pulse (!) 107, temperature 98.2 F (36.8 C), temperature source Rectal, resp. rate (!) 28, height 5\' 11"  (1.803 m), weight 63 kg (139 lb), SpO2 92 %.  PHYSICAL EXAMINATION:   Physical Exam  GENERAL:  71 y.o.-year-old patient lying in the bed, With respiratory distress and critically ill EYES: Pupils equal, round, reactive to light and accommodation. No scleral icterus. Extraocular muscles intact.  HEENT: Head atraumatic, normocephalic. Oropharynx and nasopharynx clear.  NECK:  Supple, no jugular venous distention. No  thyroid enlargement, no tenderness.  LUNGS: Bilateral coarse breath sounds. Port-A-Cath in place CARDIOVASCULAR: S1, S2 normal. No murmurs, rubs, or gallops.  ABDOMEN: Soft, nontender, nondistended. Bowel sounds present. No organomegaly or mass.  EXTREMITIES: No cyanosis. Right lower extremity edema. NEUROLOGIC: Moves all 4 extremities PSYCHIATRIC: The patient is alert and oriented. SKIN: No obvious rash, lesion, or ulcer.   LABORATORY PANEL:   CBC  Recent Labs Lab 05/30/17 0457  WBC 28.9*  HGB 10.3*  HCT 32.2*  PLT 276   ------------------------------------------------------------------------------------------------------------------ Chemistries   Recent Labs Lab 05/29/17 0945 05/30/17 0457  NA 149* 142  K 3.0* 3.1*  CL 114* 114*  CO2 23 21*  GLUCOSE 122* 124*  BUN 45* 27*  CREATININE 0.87 0.70  CALCIUM 8.5* 7.9*  AST 32  --   ALT 22  --   ALKPHOS 93  --   BILITOT 1.1  --    ------------------------------------------------------------------------------------------------------------------  Cardiac Enzymes No results for input(s): TROPONINI in the last 168 hours. ------------------------------------------------------------------------------------------------------------------  RADIOLOGY:  Dg Chest 2 View  Result Date: 05/29/2017 CLINICAL DATA:  Shortness of breath for several days. Hypoxia. Tachycardia. Prostate carcinoma. EXAM: CHEST  2 VIEW COMPARISON:  Chest CT on 01/13/2017 FINDINGS: The heart size and mediastinal contours are within normal limits. Right-sided power port is seen in appropriate position. Diffuse pulmonary interstitial infiltrates are seen which are new compared to previous CT. No evidence of pulmonary consolidation or pleural effusion. IMPRESSION: Diffuse pulmonary interstitial infiltrates. Differential diagnosis includes pulmonary edema, atypical infection, and drug reaction. Electronically Signed   By: Earle Gell M.D.   On: 05/29/2017 11:37    Ct Angio Chest Pe W/cm &/or Wo Cm  Result Date: 05/29/2017 CLINICAL DATA:  Shortness of breath, prostate  cancer, on chemotherapy EXAM: CT ANGIOGRAPHY CHEST WITH CONTRAST TECHNIQUE: Multidetector CT imaging of the chest was performed using the standard protocol during bolus administration of intravenous contrast. Multiplanar CT image reconstructions and MIPs were obtained to evaluate the vascular anatomy. CONTRAST:  75 mL Isovue 370 IV COMPARISON:  Chest radiographs dated 05/29/2017. CT chest dated 01/13/2017. FINDINGS: Cardiovascular: Satisfactory opacification the bilateral pulmonary artery to the segmental level. Evaluation is positive for bilateral pulmonary emboli, lower lobe predominant. Examples include segmental right upper lobe (series 5/image 99), distal left main at the bifurcation of the lingula (series 5/image 154), distal lobar right lower lobe (series 5/image 193), and segmental left lower lobe (series 5/ image 199). No findings to suggest right heart strain.  RV to LV ratio 0.72. The heart is normal in size.  No pericardial effusion. No evidence of thoracic aortic aneurysm. Mild atherosclerotic calcifications of the aortic arch. Right chest port terminates at the cavoatrial junction. Mediastinum/Nodes: No suspicious mediastinal lymphadenopathy. Visualized thyroid is unremarkable. Lungs/Pleura: Multifocal patchy/ ground-glass opacities in the lungs bilaterally, with a predominant peripheral distribution and relative sparing at the lung apices. This appearance is new from April 2018 and does not have a typical appearance for interstitial edema or metastatic disease. As such, multifocal infection/pneumonia is favored. No suspicious pulmonary nodules. No pleural effusion or pneumothorax. Upper Abdomen: Visualized upper abdomen is notable for cholelithiasis, without associated inflammatory changes. Musculoskeletal: Diffuse/widespread osseous metastases, grossly unchanged. Review of the MIP images  confirms the above findings. IMPRESSION: Bilateral pulmonary emboli, lobar/segmental, lower lobe predominant. Overall clot burden is moderate. No evidence of right heart strain. Multifocal patchy/ground-glass opacities in the lungs bilaterally, favoring multifocal infection/pneumonia. Diffuse/widespread osseous metastases, grossly unchanged. Critical Value/emergent results were called by telephone at the time of interpretation on 05/29/2017 at 11:44 am to Dr. Darel Hong , who verbally acknowledged these results. Aortic Atherosclerosis (ICD10-I70.0). Electronically Signed   By: Julian Hy M.D.   On: 05/29/2017 11:53   US Venous Img Lower Bilateral  Result Date: 05/29/2017 CLINICAL DATA:  Leg swelling. EXAM: BILATERAL LOWER EXTREMITY VENOUS DOPPLER ULTRASOUND TECHNIQUE: Gray-scale sonography with graded compression, as well as color Doppler and duplex ultrasound were performed to evaluate the lower extremity deep venous systems from the level of the common femoral vein and including the common femoral, femoral, profunda femoral, popliteal and calf veins including the posterior tibial, peroneal and gastrocnemius veins when visible. The superficial great saphenous vein was also interrogated. Spectral Doppler was utilized to evaluate flow at rest and with distal augmentation maneuvers in the common femoral, femoral and popliteal veins. COMPARISON:  None. FINDINGS: RIGHT LOWER EXTREMITY Common Femoral Vein: No evidence for thrombus. Normal compressibility and color Doppler flow. Augmentation not performed. Saphenofemoral Junction: No evidence of thrombus. Normal compressibility and flow on color Doppler imaging. Profunda Femoral Vein: No evidence of thrombus. Normal compressibility and flow on color Doppler imaging. Femoral Vein: Noncompressible echogenic thrombus in the mid right femoral vein. This thrombus is nonocclusive. No thrombus identified in the proximal and distal right femoral vein. Popliteal  Vein: Noncompressible echogenic thrombus in the distal right popliteal vein. Small amount of flow within the distal right popliteal vein. Calf Veins: Echogenic thrombus in the short saphenous vein. Posterior tibial and peroneal veins are noncompressible without color Doppler flow. Findings are compatible with thrombus within these deep veins. LEFT LOWER EXTREMITY Common Femoral Vein: No evidence of thrombus. Normal compressibility, respiratory phasicity and response to augmentation. Saphenofemoral Junction: No evidence of thrombus. Normal compressibility and flow on color  Doppler imaging. Profunda Femoral Vein: No evidence of thrombus. Normal compressibility and flow on color Doppler imaging. Femoral Vein: No evidence of thrombus. Normal compressibility, respiratory phasicity and response to augmentation. Popliteal Vein: No evidence of thrombus. Normal compressibility, respiratory phasicity and response to augmentation. Calf Veins: Visualized left deep calf veins are patent without thrombus. IMPRESSION: Positive for deep venous thrombosis in the right lower extremity. There is thrombus in the mid right femoral vein, distal right popliteal vein, right posterior tibial veins and right peroneal veins. Positive for superficial venous thrombosis in the right short saphenous vein. Negative for deep venous thrombosis in the left lower extremity. Electronically Signed   By: Markus Daft M.D.   On: 05/29/2017 15:33   Dg Chest Port 1 View  Result Date: 05/30/2017 CLINICAL DATA:  Shortness of breath. Acute pulmonary embolism. Metastatic prostate carcinoma. EXAM: PORTABLE CHEST 1 VIEW COMPARISON:  05/29/2017 FINDINGS: The heart size and mediastinal contours are within normal limits. Right-sided power port remains in appropriate position. Asymmetric mixed interstitial and airspace disease is again seen involving left lung greater than right, without significant change. Heart size is stable. No evidence of pneumothorax or  pleural effusion. IMPRESSION: No significant change in asymmetric interstitial and airspace disease. Electronically Signed   By: Earle Gell M.D.   On: 05/30/2017 08:15     ASSESSMENT AND PLAN:   * Acute hypoxic respiratory failure * Bilateral pulmonary emboli with extensive right lower extremity DVT * Bilateral pneumonia in immunocompromised patient * Prostate cancer with metastasis * Severe sepsis * Hypokalemia * Anemia of chronic disease * Severe protein calorie malnutrition  IV vancomycin and Zosyn. Stat nebulizer treatment. Will add IV Solu-Medrol. Oxygen to keep saturations over 92%. Discussed with Dr. Jefferson Fuel of ICU. Repeat chest x-ray reviewed.  Patient is critically ill and will discussed with patient and family regarding CODE STATUS and further goals of care. May have to transfer to ICU for aggressive treatment needed.  All the records are reviewed and case discussed with Care Management/Social Worker Management plans discussed with the patient, family and they are in agreement.  CODE STATUS: FULL CODE  DVT Prophylaxis: SCDs  TOTAL CC TIME TAKING CARE OF THIS PATIENT: 50 minutes.   Hillary Bow R M.D on 05/30/2017 at 9:34 AM  Between 7am to 6pm - Pager - [phone]  After 6pm go to [email or web address] - password [email or web address] [facility]  Office  [phone]  CC: Primary care physician; Dion Body, MD  Note: This dictation was prepared with Dragon dictation along with smaller phrase technology. Any transcriptional errors that result from this process are unintentional.

## 2017-05-30 NOTE — Consult Note (Signed)
[facility] Consultation    ASSESSMENT/PLAN   Respiratory failure. Multifactorial to include bilateral pulmonary emboli, superimposed pneumonia, metastatic disease. Reviewed CT scan  In particular the lung windows had outlining of the interlobular septa sometimes this can be seen with lymphangitic spread however is very nonspecific to includepulmonary edema and venoocclusive disease. Presently on full dose anticoagulation, Solu-Medrol, vancomycin, Zosyn and supplemental oxygen at 100%. Discussed with Dr. Darvin Neighbours, will be discussing with family goals of care and will eventually need palliative care consult. If respiratory status worsens we'll moved to the intensive care unit for intubation.  Leukocytosis. On broad-spectrum coverage  Mild anemia. No evidence of active bleeding  Hypokalemia, being replaced  Lactic acidosis. resently stable hemodynamics will follow closely   INTAKE / OUTPUT:  Intake/Output Summary (Last 24 hours) at 05/30/17 0947 Last data filed at 05/30/17 6761  Gross per 24 hour  Intake             2703 ml  Output                0 ml  Net             2703 ml   Name: Francisco Lowe MRN: [id number] DOB: July 14, 1946    ADMISSION DATE:  05/29/2017 CONSULTATION DATE:  05/30/2017  REFERRING MD :  Dr. Darvin Neighbours  CHIEF COMPLAINT:  Shortness of breath   HISTORY OF PRESENT ILLNESS:  Francisco Lowe is a very pleasant 71 year old African-American male with a past medical history remarkable for hypertension, hyperlipidemia, CVA, arthritis, hepatitis C, metastatic prostate cancer stage IV  To the bone was admitted for progressive increasing shortness of breath,found to have bilateral pulmonary emboli segmental, no evidence of right ventricular strain on CT scan, treated with full dose anticoagulation, rapid response was called this morning secondary to progressive increasing shortness with increasing FiO2 requirements. Presently on Solu-Medrol, vancomycin, Zosyn and  bronchodilators. On exam he is communicating although somewhat dyspneic with a respiratory rate of 30.   PAST MEDICAL HISTORY :  Past Medical History:  Diagnosis Date  . Arthritis   . Hepatitis C   . Hyperlipidemia   . Hypertension   . Prostate cancer ([place]) 12/2016  . Stroke [facility])    w left sided weakness per pt (2017)   Past Surgical History:  Procedure Laterality Date  . PORTA CATH Lowe N/A 01/12/2017   Procedure: Francisco Lowe;  Surgeon: Katha Cabal, MD;  Location: [facility];  Service: Cardiovascular;  Laterality: N/A;   Prior to Admission medications   Medication Sig Start Date End Date Taking? Authorizing Provider  aspirin EC 81 MG tablet Take 81 mg by mouth daily.    Yes [provider]  atorvastatin (LIPITOR) 40 MG tablet Take 40 mg by mouth at bedtime.  02/17/16  Yes [provider]  cyclobenzaprine (FLEXERIL) 10 MG tablet TAKE 0.5 TABLET BY MOUTH AT NIGHT AS NEEDED FOR MUSCLE SPASMS 08/12/16  Yes [provider]  DULoxetine (CYMBALTA) 30 MG capsule Take 1 capsule (30 mg total) by mouth as directed. Take 1 capsule daily x 1 week and then 2 capsules daily after that Patient taking differently: Take 60 mg by mouth daily.  03/25/17  Yes Sindy Guadeloupe, MD  gabapentin (NEURONTIN) 300 MG capsule Take 1 capsule (300 mg total) by mouth 3 (three) times daily. 03/04/17  Yes Sindy Guadeloupe, MD  HYDROcodone-acetaminophen (NORCO) 7.5-325 MG tablet Take 1 tablet by mouth every 6 (six) hours as  needed for moderate pain.  08/15/16  Yes [provider]  lidocaine-prilocaine (EMLA) cream Apply to affected area once 01/08/17  Yes Sindy Guadeloupe, MD  lisinopril (PRINIVIL,ZESTRIL) 5 MG tablet Take 5 mg by mouth daily.  12/01/16  Yes [provider]  megestrol (MEGACE) 400 MG/10ML suspension Take 10 mLs (400 mg total) by mouth 2 (two) times daily. 04/15/17  Yes Sindy Guadeloupe, MD  mirtazapine (REMERON) 15 MG tablet Take 15 mg by mouth  at bedtime.  05/19/16  Yes [provider]  ondansetron (ZOFRAN) 8 MG tablet Take 1 tablet (8 mg total) by mouth 2 (two) times daily as needed for refractory nausea / vomiting. 01/08/17  Yes Sindy Guadeloupe, MD  prochlorperazine (COMPAZINE) 10 MG tablet Take 1 tablet (10 mg total) by mouth every 6 (six) hours as needed (Nausea or vomiting). 01/08/17  Yes Sindy Guadeloupe, MD  sertraline (ZOLOFT) 25 MG tablet Take 25 mg by mouth. 02/25/16  Yes [provider]  fluconazole (DIFLUCAN) 100 MG tablet Take 1 tablet (100 mg total) by mouth as directed. Take 2 tablets on first day and then 1 tablet a day for 7 more days Patient not taking: Reported on 04/15/2017 01/28/17   Sindy Guadeloupe, MD   No Known Allergies  FAMILY HISTORY:  Family History  Problem Relation Age of Onset  . Cancer Sister   . Prostatitis Brother   . Prostatitis Brother   . Prostate cancer Neg Hx   . Bladder Cancer Neg Hx   . Kidney cancer Neg Hx    SOCIAL HISTORY:  reports that he quit smoking about 20 years ago. His smoking use included Cigarettes. He has a 5.00 pack-year smoking history. He has never used smokeless tobacco. He reports that he does not drink alcohol or use drugs.  REVIEW OF SYSTEMS:   The remainder of systems were reviewed and were found to be negative other than what is documented in the HPI.    VITAL SIGNS: Temp:  [97.5 F (36.4 C)-98.6 F (37 C)] 98.2 F (36.8 C) (08/26 0500) Pulse Rate:  [98-115] 107 (08/26 0909) Resp:  [20-40] 28 (08/26 0811) BP: (128-161)/(80-105) 157/91 (08/26 0811) SpO2:  [82 %-100 %] 92 % (08/26 0909) HEMODYNAMICS:     INTAKE / OUTPUT:  Intake/Output Summary (Last 24 hours) at 05/30/17 0947 Last data filed at 05/30/17 0605  Gross per 24 hour  Intake             2703 ml  Output                0 ml  Net             2703 ml    Physical Examination:   VS: BP (!) 157/91   Pulse (!) 107   Temp 98.2 F (36.8 C) (Rectal)   Resp (!) 28   Ht 5\' 11"  (1.803 m)    Wt 63 kg (139 lb)   SpO2 92%   BMI 19.39 kg/m   General Appearance: Mild to moderate respiratory distress Neuro:without focal findings, mental status HEENT: no oral lesions noted, trachea is midline Pulmonary: coarse rhonchi appreciated Cardiovascular tachycardia at 110.    Abdomen: Benign, Soft, Skin:   warm, no rashes, no ecchymosis  Extremities: no cyanosis appreciated, no edema noted   LABS: Reviewed   LABORATORY PANEL:   CBC  Recent Labs Lab 05/30/17 0457  WBC 28.9*  HGB 10.3*  HCT 32.2*  PLT 276  Chemistries   Recent Labs Lab 05/29/17 0945 05/30/17 0457  NA 149* 142  K 3.0* 3.1*  CL 114* 114*  CO2 23 21*  GLUCOSE 122* 124*  BUN 45* 27*  CREATININE 0.87 0.70  CALCIUM 8.5* 7.9*  AST 32  --   ALT 22  --   ALKPHOS 93  --   BILITOT 1.1  --     No results for input(s): GLUCAP in the last 168 hours. No results for input(s): PHART, PCO2ART, PO2ART in the last 168 hours.  Recent Labs Lab 05/29/17 0945  AST 32  ALT 22  ALKPHOS 93  BILITOT 1.1  ALBUMIN 2.4*    Cardiac Enzymes No results for input(s): TROPONINI in the last 168 hours.  RADIOLOGY:  Dg Chest 2 View  Result Date: 05/29/2017 CLINICAL DATA:  Shortness of breath for several days. Hypoxia. Tachycardia. Prostate carcinoma. EXAM: CHEST  2 VIEW COMPARISON:  Chest CT on 01/13/2017 FINDINGS: The heart size and mediastinal contours are within normal limits. Right-sided power port is seen in appropriate position. Diffuse pulmonary interstitial infiltrates are seen which are new compared to previous CT. No evidence of pulmonary consolidation or pleural effusion. IMPRESSION: Diffuse pulmonary interstitial infiltrates. Differential diagnosis includes pulmonary edema, atypical infection, and drug reaction. Electronically Signed   By: Earle Gell M.D.   On: 05/29/2017 11:37   Ct Angio Chest Pe W/cm &/or Wo Cm  Result Date: 05/29/2017 CLINICAL DATA:  Shortness of breath, prostate cancer, on  chemotherapy EXAM: CT ANGIOGRAPHY CHEST WITH CONTRAST TECHNIQUE: Multidetector CT imaging of the chest was performed using the standard protocol during bolus administration of intravenous contrast. Multiplanar CT image reconstructions and MIPs were obtained to evaluate the vascular anatomy. CONTRAST:  75 mL Isovue 370 IV COMPARISON:  Chest radiographs dated 05/29/2017. CT chest dated 01/13/2017. FINDINGS: Cardiovascular: Satisfactory opacification the bilateral pulmonary artery to the segmental level. Evaluation is positive for bilateral pulmonary emboli, lower lobe predominant. Examples include segmental right upper lobe (series 5/image 99), distal left main at the bifurcation of the lingula (series 5/image 154), distal lobar right lower lobe (series 5/image 193), and segmental left lower lobe (series 5/ image 199). No findings to suggest right heart strain.  RV to LV ratio 0.72. The heart is normal in size.  No pericardial effusion. No evidence of thoracic aortic aneurysm. Mild atherosclerotic calcifications of the aortic arch. Right chest port terminates at the cavoatrial junction. Mediastinum/Nodes: No suspicious mediastinal lymphadenopathy. Visualized thyroid is unremarkable. Lungs/Pleura: Multifocal patchy/ ground-glass opacities in the lungs bilaterally, with a predominant peripheral distribution and relative sparing at the lung apices. This appearance is new from April 2018 and does not have a typical appearance for interstitial edema or metastatic disease. As such, multifocal infection/pneumonia is favored. No suspicious pulmonary nodules. No pleural effusion or pneumothorax. Upper Abdomen: Visualized upper abdomen is notable for cholelithiasis, without associated inflammatory changes. Musculoskeletal: Diffuse/widespread osseous metastases, grossly unchanged. Review of the MIP images confirms the above findings. IMPRESSION: Bilateral pulmonary emboli, lobar/segmental, lower lobe predominant. Overall clot  burden is moderate. No evidence of right heart strain. Multifocal patchy/ground-glass opacities in the lungs bilaterally, favoring multifocal infection/pneumonia. Diffuse/widespread osseous metastases, grossly unchanged. Critical Value/emergent results were called by telephone at the time of interpretation on 05/29/2017 at 11:44 am to Dr. Darel Hong , who verbally acknowledged these results. Aortic Atherosclerosis (ICD10-I70.0). Electronically Signed   By: Julian Hy M.D.   On: 05/29/2017 11:53   US Venous Img Lower Bilateral  Result Date: 05/29/2017 CLINICAL DATA:  Leg swelling. EXAM: BILATERAL LOWER EXTREMITY VENOUS DOPPLER ULTRASOUND TECHNIQUE: Gray-scale sonography with graded compression, as well as color Doppler and duplex ultrasound were performed to evaluate the lower extremity deep venous systems from the level of the common femoral vein and including the common femoral, femoral, profunda femoral, popliteal and calf veins including the posterior tibial, peroneal and gastrocnemius veins when visible. The superficial great saphenous vein was also interrogated. Spectral Doppler was utilized to evaluate flow at rest and with distal augmentation maneuvers in the common femoral, femoral and popliteal veins. COMPARISON:  None. FINDINGS: RIGHT LOWER EXTREMITY Common Femoral Vein: No evidence for thrombus. Normal compressibility and color Doppler flow. Augmentation not performed. Saphenofemoral Junction: No evidence of thrombus. Normal compressibility and flow on color Doppler imaging. Profunda Femoral Vein: No evidence of thrombus. Normal compressibility and flow on color Doppler imaging. Femoral Vein: Noncompressible echogenic thrombus in the mid right femoral vein. This thrombus is nonocclusive. No thrombus identified in the proximal and distal right femoral vein. Popliteal Vein: Noncompressible echogenic thrombus in the distal right popliteal vein. Small amount of flow within the distal right  popliteal vein. Calf Veins: Echogenic thrombus in the short saphenous vein. Posterior tibial and peroneal veins are noncompressible without color Doppler flow. Findings are compatible with thrombus within these deep veins. LEFT LOWER EXTREMITY Common Femoral Vein: No evidence of thrombus. Normal compressibility, respiratory phasicity and response to augmentation. Saphenofemoral Junction: No evidence of thrombus. Normal compressibility and flow on color Doppler imaging. Profunda Femoral Vein: No evidence of thrombus. Normal compressibility and flow on color Doppler imaging. Femoral Vein: No evidence of thrombus. Normal compressibility, respiratory phasicity and response to augmentation. Popliteal Vein: No evidence of thrombus. Normal compressibility, respiratory phasicity and response to augmentation. Calf Veins: Visualized left deep calf veins are patent without thrombus. IMPRESSION: Positive for deep venous thrombosis in the right lower extremity. There is thrombus in the mid right femoral vein, distal right popliteal vein, right posterior tibial veins and right peroneal veins. Positive for superficial venous thrombosis in the right short saphenous vein. Negative for deep venous thrombosis in the left lower extremity. Electronically Signed   By: Markus Daft M.D.   On: 05/29/2017 15:33   Dg Chest Port 1 View  Result Date: 05/30/2017 CLINICAL DATA:  Shortness of breath. Acute pulmonary embolism. Metastatic prostate carcinoma. EXAM: PORTABLE CHEST 1 VIEW COMPARISON:  05/29/2017 FINDINGS: The heart size and mediastinal contours are within normal limits. Right-sided power port remains in appropriate position. Asymmetric mixed interstitial and airspace disease is again seen involving left lung greater than right, without significant change. Heart size is stable. No evidence of pneumothorax or pleural effusion. IMPRESSION: No significant change in asymmetric interstitial and airspace disease. Electronically Signed    By: Earle Gell M.D.   On: 05/30/2017 08:15     Hermelinda Dellen, DO 05/30/2017, 9:47 AM

## 2017-05-30 NOTE — Progress Notes (Addendum)
Advance care planning  Met with patient, healthcare power of attorney his wife, niece, and daughter at bedside. Patient's wife is admitted on the same floor who I am seeing. Discussed regarding patient's pulmonary emboli, DVT, bilateral pneumonia, severe sepsis and immunocompromised state with advanced prostate cancer. Discussed regarding patient worsening in spite of aggressive treatment with anticoagulation, broad-spectrum antibiotics and breathing treatments. Patient has lost total of 45 pounds. Has been feeling weak over the past few weeks. Initially he thought he would want to be on a ventilator if needed. I explained to him regarding intubation and CPR. Also after his wife was involved in the decision she tells me that they've discussed this in the past and did not want artificial life support. Patient does not want to be intubated or have CPR or defibrillation. We discussed regarding DO NOT RESUSCITATE status. He agrees. Orders entered.  Ordered Roxanol for pain or shortness of breath. At this time family and patient would want comfort to be a priority. We will continue IV antibiotics for 24 hours. If there is any worsening patient will be transitioned to comfort measures.  Time spent 35 minutes.

## 2017-05-30 NOTE — Significant Event (Signed)
Rapid Response Event Note  Overview: called for rapid response in room 118 for sob/ decreased 02 sats.      Initial Focused Assessment: pt lying in bed, no distress noted, alert, oriented x 3, ST 103 on monitor, 02 sats 100% on NRB.    Interventions: Spoke with Dr Darvin Neighbours, stat portable chest xray ordered, Dr Jefferson Fuel in to assess pt. Dr Darvin Neighbours and [place] spoke, no escalation of care at this time.  Plan of Care (if not transferred): Opal Sidles, RN aware of plan and to call with changes in patient condition.  Event Summary:   at      at          [place]

## 2017-05-30 NOTE — Progress Notes (Signed)
Patient had several runs of PVC and at some point was running bigemenys per central tele. MD was notified. No new orders made. Continued to monitor patient. He was asymptomatic for any chest pain or chest discomforts. Kept safe and comfortable. Needs attended. O2 supplement at 5LPM/Nasal cannula saturating at 92%.

## 2017-05-31 ENCOUNTER — Other Ambulatory Visit: Payer: Self-pay | Admitting: Oncology

## 2017-05-31 DIAGNOSIS — Z515 Encounter for palliative care: Secondary | ICD-10-CM

## 2017-05-31 DIAGNOSIS — I2699 Other pulmonary embolism without acute cor pulmonale: Secondary | ICD-10-CM

## 2017-05-31 DIAGNOSIS — C61 Malignant neoplasm of prostate: Secondary | ICD-10-CM

## 2017-05-31 DIAGNOSIS — Z7189 Other specified counseling: Secondary | ICD-10-CM

## 2017-05-31 LAB — VANCOMYCIN, TROUGH: Vancomycin Tr: 17 ug/mL (ref 15–20)

## 2017-05-31 MED ORDER — MORPHINE SULFATE (CONCENTRATE) 10 MG /0.5 ML PO SOLN: 10.0000 mg | ORAL | 0 refills | Status: DC | PRN

## 2017-05-31 MED ORDER — LORAZEPAM 1 MG PO TABS: 1.0000 mg | ORAL_TABLET | ORAL | Status: DC | PRN

## 2017-05-31 MED ORDER — LORAZEPAM 0.5 MG PO TABS: 0.5000 mg | ORAL_TABLET | ORAL | 0 refills | Status: AC | PRN

## 2017-05-31 MED ORDER — MORPHINE SULFATE 10 MG/5ML PO SOLN
5.0000 mg | ORAL | Status: DC | PRN
  Administered 2017-05-31: 5 mg via ORAL
  Filled 2017-05-31: qty 5

## 2017-05-31 MED ORDER — ONDANSETRON HCL 8 MG PO TABS: 8.0000 mg | ORAL_TABLET | Freq: Three times a day (TID) | ORAL | 0 refills | Status: DC | PRN

## 2017-05-31 MED ORDER — MORPHINE SULFATE (CONCENTRATE) 10 MG/0.5ML PO SOLN
5.0000 mg | ORAL | Status: DC | PRN
  Administered 2017-05-31: 20:00:00 5 mg via SUBLINGUAL
  Filled 2017-05-31: qty 1

## 2017-05-31 MED ORDER — MORPHINE SULFATE (CONCENTRATE) 10 MG/0.5ML PO SOLN
5.0000 mg | ORAL | Status: DC | PRN
  Administered 2017-05-31 – 2017-06-01 (×8): 5 mg via SUBLINGUAL
  Filled 2017-05-31 (×8): qty 1

## 2017-05-31 NOTE — Care Management Note (Signed)
Case Management Note  Patient Details  Name: SEON GAERTNER MRN: [id number] Date of Birth: [birth year]  Subjective/Objective:   Admitted to [facility] with the diagnosis of bilateral pulmonary embolism. Lives with wife, Caren Griffins [phone]). Last seen Dr. Rae Roam at [facility] a week ago. Just started going to the [facility] in [place]. An appointment was scheduled for next week to see a physician, Will fax update to [facility].  Currently transitioning to comfort care.                   Action/Plan: Received a referral for wife would like to take her husband home with hospice services in place.  Spoke with wife in room 105 (she is a current patient). Discussed Hospice Agencies. [place]. Will update Flo Shanks, RN representative for hospice of [facility]    Expected Discharge Date:  05/31/17               Expected Discharge Plan:     In-House Referral:   yes  Discharge planning Services     Post Acute Care Choice:   yes Choice offered to:   wife  DME Arranged:    DME Agency:     HH Arranged:   yes HH Agency:   [facility]  Status of Service:     If discussed at [employer] of Stay Meetings, dates discussed:    Additional Comments:  Shelbie Ammons, [place] [facility] [phone] 05/31/2017, 1:39 PM

## 2017-05-31 NOTE — Progress Notes (Signed)
Patient on semi fowlers position, family members in and out of room to visit patient. Patient noted to be resting well. No signs of distress except for shallow breathing. When asked if he is in pain or hurting, he denied hurting. Remained comfortable with Oxygen therapy at 5LPM/[place]. Patient repositioned in comforts. Kept safe and comfortable. He refused to take his oral medicines but have gotten his Lovenox SQ and IV antibiotics. Brief wet, changed to new one. Needs attended.

## 2017-05-31 NOTE — Consult Note (Signed)
Consultation Note Date: 05/31/2017   Patient Name: Francisco Lowe  DOB: 1946-08-18  MRN: [id number]  Age / Sex: 71 y.o., male  PCP: Dion Body, MD Referring Physician: Hillary Bow, MD  Reason for Consultation: Establishing goals of care and Terminal Care  HPI/Patient Profile: 71 y.o. male  with past medical history of Stage IV prostate ca (with bone mets, previously being treated with taxotere- last treatment on 05/06/17), hyperlipidemia, hypertension, stroke with Left sided wknss, admitted on 05/29/2017 with increasing SOB. Workup revealed bilateral pulmonary embolus, multifocal pneumonia, sepsis secondary to pneumonia.   Clinical Assessment and Goals of Care: Met with patient's daughter and spouse.   I introduced Palliative Medicine as specialized medical care for people living with serious illness. It focuses on providing relief from the symptoms and stress of a serious illness. The goal is to improve quality of life for both the patient and the family.  We discussed a brief life review of the patient. He used to be very active and enjoyed yard work. If he couldn't do it himself, he wouldn't want anyone else to do it. Being independent was very important to him.   We discussed their current illness and what it means in the larger context of their on-going co-morbidities.  Natural disease trajectory and expectations at EOL were discussed. His family is aware that he is dying. They state that when he was diagnosed with stage IV prostate cancer at age 23 they knew he would not have much longer to live.   I attempted to elicit values and goals of care important to the patient. His wife notes it is important to try and get him home to die with her at home. She says, "I just want to take my husband home." Unfortunately, she is also hospitalized at the moment. Her doctor is coordinating with ID to attempt  outpatient treatment for her cellulitis.   The difference between aggressive medical intervention and comfort care was considered in light of the patient's goals of care. Patient's family has chosen to transition to comfort care at this time. Patient's wife does not want to prolong patient's life, her goal is to provide patient with comfort. They request IV antibiotics to be stopped.   Advanced directives, concepts specific to code status, artifical feeding and hydration, and rehospitalization were considered and discussed. The goals of care are to attempt to transition patient's care so that he can be discharged home with Hospice so that he can die at home.   Hospice and Palliative Care services outpatient were explained and offered. Family requests hospice services.  Primary Decision Maker NEXT OF KIN - spouse-  Francisco Lowe  SUMMARY OF RECOMMENDATIONS -Transition to comfort care -Referral for Hospice services at home -Attempt to titrate O2 to Nasal cannula with help of oral morphine -Morphine 61m SL q1hr prn SOB     Code Status/Advance Care Planning:  DNR   Palliative Prophylaxis:   Aspiration, Delirium Protocol and Frequent Pain Assessment  Additional Recommendations (Limitations, Scope, Preferences):  Full Comfort  Care  Prognosis:    < 2 weeks  Discharge Planning: Home with Hospice  Primary Diagnoses: Present on Admission: . Bilateral pulmonary embolism ([place])   I have reviewed the medical record, interviewed the patient and family, and examined the patient. The following aspects are pertinent.  Past Medical History:  Diagnosis Date  . Arthritis   . Hepatitis C   . Hyperlipidemia   . Hypertension   . Prostate cancer ([place]) 12/2016  . Stroke [facility])    w left sided weakness per pt (2017)   Social History   Social History  . Marital status: Married    Spouse name: N/A  . Number of children: N/A  . Years of education: N/A   Social History Main Topics  .  Smoking status: Former Smoker    Packs/day: 0.25    Years: 20.00    Types: Cigarettes    Quit date: 01/07/1997  . Smokeless tobacco: Never Used  . Alcohol use No  . Drug use: No  . Sexual activity: No   Other Topics Concern  . None   Social History Narrative  . None   Family History  Problem Relation Age of Onset  . Cancer Sister   . Prostatitis Brother   . Prostatitis Brother   . Prostate cancer Neg Hx   . Bladder Cancer Neg Hx   . Kidney cancer Neg Hx    Scheduled Meds: . aspirin EC  81 mg Oral Daily  . atorvastatin  40 mg Oral QHS  . cyclobenzaprine  5 mg Oral TID  . DULoxetine  60 mg Oral Daily  . enoxaparin (LOVENOX) injection  65 mg Subcutaneous Q12H  . feeding supplement (ENSURE ENLIVE)  237 mL Oral TID BM  . gabapentin  300 mg Oral TID  . lisinopril  5 mg Oral Daily  . mouth rinse  15 mL Mouth Rinse BID  . megestrol  400 mg Oral BID  . methylPREDNISolone (SOLU-MEDROL) injection  60 mg Intravenous Daily  . mirtazapine  15 mg Oral QHS  . sertraline  25 mg Oral Daily   Continuous Infusions: . 0.9 % NaCl with KCl 20 mEq / L 75 mL/hr at 05/31/17 0549  . piperacillin-tazobactam (ZOSYN)  IV Stopped (05/31/17 0949)  . vancomycin 1,000 mg (05/31/17 0939)   PRN Meds:.acetaminophen **OR** acetaminophen, HYDROcodone-acetaminophen, LORazepam, morphine, ondansetron **OR** ondansetron (ZOFRAN) IV, prochlorperazine, sodium chloride flush Medications Prior to Admission:  Prior to Admission medications   Medication Sig Start Date End Date Taking? Authorizing Provider  aspirin EC 81 MG tablet Take 81 mg by mouth daily.    Yes [provider]  atorvastatin (LIPITOR) 40 MG tablet Take 40 mg by mouth at bedtime.  02/17/16  Yes [provider]  cyclobenzaprine (FLEXERIL) 10 MG tablet TAKE 0.5 TABLET BY MOUTH AT NIGHT AS NEEDED FOR MUSCLE SPASMS 08/12/16  Yes [provider]  DULoxetine (CYMBALTA) 30 MG capsule Take 1 capsule (30 mg total) by mouth as  directed. Take 1 capsule daily x 1 week and then 2 capsules daily after that Patient taking differently: Take 60 mg by mouth daily.  03/25/17  Yes Sindy Guadeloupe, MD  gabapentin (NEURONTIN) 300 MG capsule Take 1 capsule (300 mg total) by mouth 3 (three) times daily. 03/04/17  Yes Sindy Guadeloupe, MD  HYDROcodone-acetaminophen (NORCO) 7.5-325 MG tablet Take 1 tablet by mouth every 6 (six) hours as needed for moderate pain.  08/15/16  Yes [provider]  lidocaine-prilocaine (EMLA) cream Apply to  affected area once 01/08/17  Yes Sindy Guadeloupe, MD  lisinopril (PRINIVIL,ZESTRIL) 5 MG tablet Take 5 mg by mouth daily.  12/01/16  Yes [provider]  megestrol (MEGACE) 400 MG/10ML suspension Take 10 mLs (400 mg total) by mouth 2 (two) times daily. 04/15/17  Yes Sindy Guadeloupe, MD  mirtazapine (REMERON) 15 MG tablet Take 15 mg by mouth at bedtime.  05/19/16  Yes [provider]  ondansetron (ZOFRAN) 8 MG tablet Take 1 tablet (8 mg total) by mouth 2 (two) times daily as needed for refractory nausea / vomiting. 01/08/17  Yes Sindy Guadeloupe, MD  prochlorperazine (COMPAZINE) 10 MG tablet Take 1 tablet (10 mg total) by mouth every 6 (six) hours as needed (Nausea or vomiting). 01/08/17  Yes Sindy Guadeloupe, MD  sertraline (ZOLOFT) 25 MG tablet Take 25 mg by mouth. 02/25/16  Yes [provider]  fluconazole (DIFLUCAN) 100 MG tablet Take 1 tablet (100 mg total) by mouth as directed. Take 2 tablets on first day and then 1 tablet a day for 7 more days Patient not taking: Reported on 04/15/2017 01/28/17   Sindy Guadeloupe, MD   No Known Allergies Review of Systems  Unable to perform ROS: Mental status change    Physical Exam  Constitutional:  Frail, cachexic  Pulmonary/Chest:  On high flow O2  Abdominal: Soft.  Neurological:  Lethargic, unarouseable  Skin: There is pallor.  Nursing note and vitals reviewed.   Vital Signs: BP (!) 141/80 (BP Location: Right Arm)   Pulse (!) 105   Temp  (!) 97.5 F (36.4 C) (Oral)   Resp (!) 26   Ht _0  (1.803 m)   Wt 63 kg (139 lb)   SpO2 95%   BMI 19.39 kg/m  Pain Assessment: 0-10   Pain Score: Asleep   SpO2: SpO2: 95 % O2 Device:SpO2: 95 % O2 Flow Rate: .O2 Flow Rate (L/min): 45 L/min  IO: Intake/output summary:  Intake/Output Summary (Last 24 hours) at 05/31/17 1152 Last data filed at 05/31/17 0549  Gross per 24 hour  Intake          3827.75 ml  Output                0 ml  Net          3827.75 ml    LBM: Last BM Date: 05/28/17 Baseline Weight: Weight: 63 kg (139 lb) Most recent weight: Weight: 63 kg (139 lb)     Palliative Assessment/Data: PPS: 10%     Thank you for this consult. Palliative medicine will continue to follow and assist as needed.   Time In: 1045 Time Out: 1205 Time Total: 80 minutes Greater than 50%  of this time was spent counseling and coordinating care related to the above assessment and plan.  Signed by: Mariana Kaufman, AGNP-C Palliative Medicine    Please contact Palliative Medicine Team phone at [phone] for questions and concerns.  For individual provider: See Shea Evans

## 2017-05-31 NOTE — Progress Notes (Signed)
Patient verbalized that he don't feel the oxygen flowing and requested for a non-rebreather mask. Patient was placed on NRM, verbalized ease with breathing. No other complaints made as of this time. Needs attended. Family member at bedside.

## 2017-05-31 NOTE — Progress Notes (Signed)
Initial Nutrition Assessment  DOCUMENTATION CODES:   Severe malnutrition in context of chronic illness  INTERVENTION:  Consider discontinuing order for Ensure Enlive TID or making order PRN as patient is now transitioning to comfort care.  No further nutrition interventions warranted at this time.  NUTRITION DIAGNOSIS:   Malnutrition (Severe) related to chronic illness (stg IV prostate cancer) as evidenced by severe depletion of body fat, severe depletion of muscle mass, 14.1 percent weight loss over 4 months.  GOAL:   Patient will meet greater than or equal to 90% of their needs  MONITOR:   PO intake, Labs, I & O's, Skin  REASON FOR ASSESSMENT:   Malnutrition Screening Tool    ASSESSMENT:   71 year old male with PMHx of HTN, HLD, arthritis, hepatitis C, hx of CVA with left sided weakness, stage IV prostate cancer with bone metastases who was admitted with acute respiratory distress secondary to bilateral pulmonary embolism and PNA.   -PMT met with patient's family. Per note that was signed after RD assessment, patient is now transitioning to full comfort care.  Spoke with patient's daughter at bedside. Patient has not been eating well for over one week. He is only able to eat bites of one meal per day. He does enjoy chocolate Ensure and chocolate ice cream, so he has been having some of that. He is also sipping water and juice. Daughter reports patient has lost a lot of weight since his diagnosis. Patient was 161.9 lbs on 01/21/2017 and has lost 22.9 lbs (14.1% body weight) over 4 months, which is significant for time frame.  Medications reviewed and include: Megace 400 mg BID, Remeron 15 mg QHS. Patient has already been ordered for Ensure Enlive TID.  Labs reviewed: Potassium 3.1, Chloride 114, BUN 27.  Limited Nutrition-Focused physical exam completed. Findings are severe fat depletion of orbital region and upper arm region, severe muscle depletion of temple region, clavicle  bone region, and clavicle/acromion bone region.  Diet Order:  Diet regular Room service appropriate? Yes; Fluid consistency: Thin  Skin:  Wound (see comment) (Stg II to right ankle)  Last BM:  PTA  Height:   Ht Readings from Last 1 Encounters:  05/29/17 5' 11"  (1.803 m)    Weight:   Wt Readings from Last 1 Encounters:  05/29/17 139 lb (63 kg)    Ideal Body Weight:  78.2 kg  BMI:  Body mass index is 19.39 kg/m.  Estimated Nutritional Needs:   Kcal:  1830-2110 (MSJ x 1.3-1.5)  Protein:  82-95 grams (1.3-1.5 grams/kg)  Fluid:  1.9 L/day (25 ml/kg)  EDUCATION NEEDS:   No education needs identified at this time  Willey Blade, MS, RD, LDN Pager: [phone] After Hours Pager: [phone]

## 2017-05-31 NOTE — Progress Notes (Signed)
[facility] [place] NAME: Francisco Lowe    MR#:  [id number]  DATE OF BIRTH:  01-18-1946  SUBJECTIVE:  CHIEF COMPLAINT:   Chief Complaint  Patient presents with  . Shortness of Breath   On high flow nasal cannula with nonrebreather. Desats easily. Patient awake and alert. Shortness of breath seems to have improved a little.  REVIEW OF SYSTEMS:    Review of Systems  Constitutional: Positive for malaise/fatigue. Negative for chills and fever.  HENT: Negative for sore throat.   Eyes: Negative for blurred vision, double vision and pain.  Respiratory: Positive for cough and shortness of breath. Negative for hemoptysis and wheezing.   Cardiovascular: Negative for chest pain, palpitations, orthopnea and leg swelling.  Gastrointestinal: Negative for abdominal pain, constipation, diarrhea, heartburn, nausea and vomiting.  Genitourinary: Negative for dysuria and hematuria.  Musculoskeletal: Negative for back pain and joint pain.  Skin: Negative for rash.  Neurological: Positive for weakness. Negative for sensory change, speech change, focal weakness and headaches.  Endo/Heme/Allergies: Does not bruise/bleed easily.  Psychiatric/Behavioral: Negative for depression. The patient is not nervous/anxious.     DRUG ALLERGIES:  No Known Allergies  VITALS:  Blood pressure (!) 141/80, pulse (!) 105, temperature (!) 97.5 F (36.4 C), temperature source Oral, resp. rate (!) 26, height 5\' 11"  (1.803 m), weight 63 kg (139 lb), SpO2 95 %.  PHYSICAL EXAMINATION:   Physical Exam  GENERAL:  71 y.o.-year-old patient lying in the bed, With respiratory distress and critically ill EYES: Pupils equal, round, reactive to light and accommodation. No scleral icterus. Extraocular muscles intact.  HEENT: Head atraumatic, normocephalic. Oropharynx and nasopharynx clear.  NECK:  Supple, no jugular venous distention. No thyroid enlargement, no tenderness.  LUNGS:  Bilateral coarse breath sounds. Port-A-Cath in place CARDIOVASCULAR: S1, S2 normal. No murmurs, rubs, or gallops.  ABDOMEN: Soft, nontender, nondistended. Bowel sounds present. No organomegaly or mass.  EXTREMITIES: No cyanosis. Right lower extremity edema. NEUROLOGIC: Moves all 4 extremities PSYCHIATRIC: The patient is alert and oriented. SKIN: No obvious rash, lesion, or ulcer.   LABORATORY PANEL:   CBC  Recent Labs Lab 05/30/17 0457  WBC 28.9*  HGB 10.3*  HCT 32.2*  PLT 276   ------------------------------------------------------------------------------------------------------------------ Chemistries   Recent Labs Lab 05/29/17 0945 05/30/17 0457  NA 149* 142  K 3.0* 3.1*  CL 114* 114*  CO2 23 21*  GLUCOSE 122* 124*  BUN 45* 27*  CREATININE 0.87 0.70  CALCIUM 8.5* 7.9*  AST 32  --   ALT 22  --   ALKPHOS 93  --   BILITOT 1.1  --    ------------------------------------------------------------------------------------------------------------------  Cardiac Enzymes No results for input(s): TROPONINI in the last 168 hours. ------------------------------------------------------------------------------------------------------------------  RADIOLOGY:  Dg Chest 2 View  Result Date: 05/29/2017 CLINICAL DATA:  Shortness of breath for several days. Hypoxia. Tachycardia. Prostate carcinoma. EXAM: CHEST  2 VIEW COMPARISON:  Chest CT on 01/13/2017 FINDINGS: The heart size and mediastinal contours are within normal limits. Right-sided power port is seen in appropriate position. Diffuse pulmonary interstitial infiltrates are seen which are new compared to previous CT. No evidence of pulmonary consolidation or pleural effusion. IMPRESSION: Diffuse pulmonary interstitial infiltrates. Differential diagnosis includes pulmonary edema, atypical infection, and drug reaction. Electronically Signed   By: Earle Gell M.D.   On: 05/29/2017 11:37   Ct Angio Chest Pe W/cm &/or Wo Cm  Result  Date: 05/29/2017 CLINICAL DATA:  Shortness of breath, prostate cancer, on chemotherapy EXAM:  CT ANGIOGRAPHY CHEST WITH CONTRAST TECHNIQUE: Multidetector CT imaging of the chest was performed using the standard protocol during bolus administration of intravenous contrast. Multiplanar CT image reconstructions and MIPs were obtained to evaluate the vascular anatomy. CONTRAST:  75 mL Isovue 370 IV COMPARISON:  Chest radiographs dated 05/29/2017. CT chest dated 01/13/2017. FINDINGS: Cardiovascular: Satisfactory opacification the bilateral pulmonary artery to the segmental level. Evaluation is positive for bilateral pulmonary emboli, lower lobe predominant. Examples include segmental right upper lobe (series 5/image 99), distal left main at the bifurcation of the lingula (series 5/image 154), distal lobar right lower lobe (series 5/image 193), and segmental left lower lobe (series 5/ image 199). No findings to suggest right heart strain.  RV to LV ratio 0.72. The heart is normal in size.  No pericardial effusion. No evidence of thoracic aortic aneurysm. Mild atherosclerotic calcifications of the aortic arch. Right chest port terminates at the cavoatrial junction. Mediastinum/Nodes: No suspicious mediastinal lymphadenopathy. Visualized thyroid is unremarkable. Lungs/Pleura: Multifocal patchy/ ground-glass opacities in the lungs bilaterally, with a predominant peripheral distribution and relative sparing at the lung apices. This appearance is new from April 2018 and does not have a typical appearance for interstitial edema or metastatic disease. As such, multifocal infection/pneumonia is favored. No suspicious pulmonary nodules. No pleural effusion or pneumothorax. Upper Abdomen: Visualized upper abdomen is notable for cholelithiasis, without associated inflammatory changes. Musculoskeletal: Diffuse/widespread osseous metastases, grossly unchanged. Review of the MIP images confirms the above findings. IMPRESSION: Bilateral  pulmonary emboli, lobar/segmental, lower lobe predominant. Overall clot burden is moderate. No evidence of right heart strain. Multifocal patchy/ground-glass opacities in the lungs bilaterally, favoring multifocal infection/pneumonia. Diffuse/widespread osseous metastases, grossly unchanged. Critical Value/emergent results were called by telephone at the time of interpretation on 05/29/2017 at 11:44 am to Dr. Darel Hong , who verbally acknowledged these results. Aortic Atherosclerosis (ICD10-I70.0). Electronically Signed   By: Julian Hy M.D.   On: 05/29/2017 11:53   US Venous Img Lower Bilateral  Result Date: 05/29/2017 CLINICAL DATA:  Leg swelling. EXAM: BILATERAL LOWER EXTREMITY VENOUS DOPPLER ULTRASOUND TECHNIQUE: Gray-scale sonography with graded compression, as well as color Doppler and duplex ultrasound were performed to evaluate the lower extremity deep venous systems from the level of the common femoral vein and including the common femoral, femoral, profunda femoral, popliteal and calf veins including the posterior tibial, peroneal and gastrocnemius veins when visible. The superficial great saphenous vein was also interrogated. Spectral Doppler was utilized to evaluate flow at rest and with distal augmentation maneuvers in the common femoral, femoral and popliteal veins. COMPARISON:  None. FINDINGS: RIGHT LOWER EXTREMITY Common Femoral Vein: No evidence for thrombus. Normal compressibility and color Doppler flow. Augmentation not performed. Saphenofemoral Junction: No evidence of thrombus. Normal compressibility and flow on color Doppler imaging. Profunda Femoral Vein: No evidence of thrombus. Normal compressibility and flow on color Doppler imaging. Femoral Vein: Noncompressible echogenic thrombus in the mid right femoral vein. This thrombus is nonocclusive. No thrombus identified in the proximal and distal right femoral vein. Popliteal Vein: Noncompressible echogenic thrombus in the distal  right popliteal vein. Small amount of flow within the distal right popliteal vein. Calf Veins: Echogenic thrombus in the short saphenous vein. Posterior tibial and peroneal veins are noncompressible without color Doppler flow. Findings are compatible with thrombus within these deep veins. LEFT LOWER EXTREMITY Common Femoral Vein: No evidence of thrombus. Normal compressibility, respiratory phasicity and response to augmentation. Saphenofemoral Junction: No evidence of thrombus. Normal compressibility and flow on color Doppler imaging. Profunda Femoral  Vein: No evidence of thrombus. Normal compressibility and flow on color Doppler imaging. Femoral Vein: No evidence of thrombus. Normal compressibility, respiratory phasicity and response to augmentation. Popliteal Vein: No evidence of thrombus. Normal compressibility, respiratory phasicity and response to augmentation. Calf Veins: Visualized left deep calf veins are patent without thrombus. IMPRESSION: Positive for deep venous thrombosis in the right lower extremity. There is thrombus in the mid right femoral vein, distal right popliteal vein, right posterior tibial veins and right peroneal veins. Positive for superficial venous thrombosis in the right short saphenous vein. Negative for deep venous thrombosis in the left lower extremity. Electronically Signed   By: Markus Daft M.D.   On: 05/29/2017 15:33   Dg Chest Port 1 View  Result Date: 05/30/2017 CLINICAL DATA:  Shortness of breath. Acute pulmonary embolism. Metastatic prostate carcinoma. EXAM: PORTABLE CHEST 1 VIEW COMPARISON:  05/29/2017 FINDINGS: The heart size and mediastinal contours are within normal limits. Right-sided power port remains in appropriate position. Asymmetric mixed interstitial and airspace disease is again seen involving left lung greater than right, without significant change. Heart size is stable. No evidence of pneumothorax or pleural effusion. IMPRESSION: No significant change in  asymmetric interstitial and airspace disease. Electronically Signed   By: Earle Gell M.D.   On: 05/30/2017 08:15     ASSESSMENT AND PLAN:   * Acute hypoxic respiratory failure * Bilateral pulmonary emboli with extensive right lower extremity DVT * Bilateral pneumonia in immunocompromised patient * Prostate cancer with metastasis * Severe sepsis * Hypokalemia * Anemia of chronic disease * Severe protein calorie malnutrition  On broad-spectrum IV antibiotics. We will switch him to high flow nasal cannula Patient continues to be short of breath. He feels better than yesterday. Her not checking any blood work.  I discussed with palliative care. Will see patient. We'll continue antibiotics without escalation of care  All the records are reviewed and case discussed with Care Management/Social Worker Management plans discussed with the patient, family and they are in agreement.  CODE STATUS: FULL CODE  DVT Prophylaxis: SCDs  TOTAL CC TIME TAKING CARE OF THIS PATIENT: 50 minutes.   Hillary Bow R M.D on 05/31/2017 at 10:01 AM  Between 7am to 6pm - Pager - [phone]  After 6pm go to [email or web address] - password [email or web address] [facility]  Office  [phone]  CC: Primary care physician; Dion Body, MD  Note: This dictation was prepared with Dragon dictation along with smaller phrase technology. Any transcriptional errors that result from this process are unintentional.

## 2017-05-31 NOTE — Progress Notes (Signed)
Pharmacy Antibiotic Note  Francisco Lowe is a 71 y.o. male admitted on 05/29/2017 with pneumonia.  Pharmacy has been consulted for Zosyn and vancomycin dosing.  Plan: 1. Zosyn 3.375 gm IV Q8H EI 2. Vancomycin 1 gm IV x 1 followed in 6 hours (stacked dosing) by vancomycin 1 gm IV Q12H, predicted trough 19 mcg/mL.   8/27 Vancomycin trough was therapeutic at 17 mcg/mL. Will continue vancomycin at 1gm Q12H. Pharmacy will continue to monitor and adjust as needed.    Vd 49.3 L, Ke 0.061 hr-1, T1/2 11.3 hr  Height: 5\' 11"  (180.3 cm) Weight: 139 lb (63 kg) IBW/kg (Calculated) : 75.3  Temp (24hrs), Avg:97.3 F (36.3 C), Min:96.9 F (36.1 C), Max:97.7 F (36.5 C)   Recent Labs Lab 05/29/17 0945 05/29/17 1320 05/30/17 0457 05/31/17 0925  WBC 24.3*  --  28.9*  --   CREATININE 0.87  --  0.70  --   LATICACIDVEN 3.1* 3.6*  --   --   VANCOTROUGH  --   --   --  17    Estimated Creatinine Clearance: 74.5 mL/min (by C-G formula based on SCr of 0.7 mg/dL).    No Known Allergies   Microbiology Results: BCx 8/25>> D2 NG  Thank you for allowing pharmacy to be a part of this patient's care.  Lendon Ka, PharmD Pharmacy Resident 05/31/2017 10:30 AM

## 2017-06-01 MED ORDER — HEPARIN SOD (PORK) LOCK FLUSH 100 UNIT/ML IV SOLN
500.0000 [IU] | Freq: Once | INTRAVENOUS | Status: AC
  Administered 2017-06-01: 500 [IU] via INTRAVENOUS
  Filled 2017-06-01: qty 5

## 2017-06-01 NOTE — Care Management Important Message (Signed)
Important Message  Patient Details  Name: Francisco Lowe MRN: [id number] Date of Birth: 27-Dec-1945   Medicare Important Message Given:  Yes    Shelbie Ammons, RN 06/01/2017, 8:14 AM

## 2017-06-01 NOTE — Clinical Social Work Note (Signed)
Case discussed with case manager and plan is to discharge home with hospice.  CSW to sign off please re-consult if social work needs arise.  Jones Broom. [place], MSW, [place]

## 2017-06-01 NOTE — Care Management (Signed)
Discharge to home today per Dr. Darvin Neighbours.  Will be followed by [facility]. Transportation will be arranged per  [employer]. Shelbie Ammons RN MSN CCM Care Management [phone]

## 2017-06-01 NOTE — Progress Notes (Signed)
Daily Progress Note   Patient Name: Francisco Lowe       Date: 06/01/2017 DOB: November 22, 1945  Age: 71 y.o. MRN#: [id number] Attending Physician: Hillary Bow, MD Primary Care Physician: Dion Body, MD Admit Date: 05/29/2017  Reason for Consultation/Follow-up: Establishing goals of care and Terminal Care  Subjective: Patient sitting up in bed. Awake. No complaints. On O2 [place]. Spouse at bedside. Arrangements now made for d/c to home with Hospice. They are waiting for phone call that DME has been delivered and then patient will be discharged. They are thankful for palliative assistance.   ROS  Length of Stay: 3  Current Medications: Scheduled Meds:  . DULoxetine  60 mg Oral Daily  . feeding supplement (ENSURE ENLIVE)  237 mL Oral TID BM  . mouth rinse  15 mL Mouth Rinse BID  . sertraline  25 mg Oral Daily    Continuous Infusions:   PRN Meds: acetaminophen **OR** acetaminophen, LORazepam, morphine CONCENTRATE, ondansetron **OR** ondansetron (ZOFRAN) IV, prochlorperazine, sodium chloride flush  Physical Exam          Vital Signs: BP 117/76 (BP Location: Right Arm)   Pulse 94   Temp 97.8 F (36.6 C) (Oral)   Resp 16   Ht 5\' 11"  (1.803 m)   Wt 63 kg (139 lb)   SpO2 100%   BMI 19.39 kg/m  SpO2: SpO2: 100 % O2 Device: O2 Device: Nasal Cannula O2 Flow Rate: O2 Flow Rate (L/min): 6 L/min  Intake/output summary:  Intake/Output Summary (Last 24 hours) at 06/01/17 1353 Last data filed at 06/01/17 0900  Gross per 24 hour  Intake              300 ml  Output              100 ml  Net              200 ml   LBM: Last BM Date: 05/30/17 Baseline Weight: Weight: 63 kg (139 lb) Most recent weight: Weight: 63 kg (139 lb)       Palliative Assessment/Data: PPS:  20%      Patient Active Problem List   Diagnosis Date Noted  . Prostate cancer ([place])   . Palliative care by specialist   . Advance care planning   . Bilateral pulmonary embolism ([place])  05/29/2017  . Pressure injury of skin 05/29/2017  . Chemotherapy-induced neuropathy ([place]) 03/04/2017  . Prostate cancer metastatic to bone ([place]) 01/08/2017  . Goals of care, counseling/discussion 01/08/2017  . Chronic low back pain 12/07/2016  . Erectile dysfunction 12/07/2016  . Essential hypertension 12/07/2016  . Hepatitis C 12/07/2016  . Medicare annual wellness visit, initial 06/19/2016  . Status post CVA 02/17/2016  . HTN (hypertension) 12/20/2015  . Stroke ([place]) 12/20/2015  . DDD (degenerative disc disease), lumbar 06/19/2014  . Left lumbar radiculitis 06/18/2014  . Diverticulosis 06/11/2004    Palliative Care Assessment & Plan   Patient Profile: 71 y.o. male  with past medical history of Stage IV prostate ca (with bone mets, previously being treated with taxotere- last treatment on 05/06/17), hyperlipidemia, hypertension, stroke with Left sided wknss, admitted on 05/29/2017 with increasing SOB. Workup revealed bilateral pulmonary embolus, multifocal pneumonia, sepsis secondary to pneumonia.   Assessment/Recommendations/Plan   D/C home with hospice as planned  Continue symptom management medications as ordered  Goals of Care and Additional Recommendations:  Limitations on Scope of Treatment: Full Comfort Care  Code Status:  DNR  Prognosis:   < 2 weeks d/t bilateral pulmonary emboli, multifocal pnuemonia, sepsis, with clinical worsening despite aggressive therapy- now transitioning to full comfort care with Hospice support  Discharge Planning:  Home with Hospice  Care plan was discussed with patient and spouse.  Thank you for allowing the Palliative Medicine Team to assist in the care of this patient.   Time In: 1000 Time Out: 1025 Total Time 25 minutes Prolonged Time  Billed No      Greater than 50%  of this time was spent counseling and coordinating care related to the above assessment and plan.  Mariana Kaufman, AGNP-C Palliative Medicine   Please contact Palliative Medicine Team phone at [phone] for questions and concerns.

## 2017-06-01 NOTE — Discharge Summary (Addendum)
[facility] [place] NAME: Francisco Lowe    MR#:  [id number]  DATE OF BIRTH:  [birth year]  DATE OF ADMISSION:  05/29/2017 ADMITTING PHYSICIAN: Nicholes Mango, MD  DATE OF DISCHARGE: 06/01/2017  PRIMARY CARE PHYSICIAN: Dion Body, MD   ADMISSION DIAGNOSIS:  Shortness of breath [R06.02] Prostate cancer ([place]) [C61] Leg swelling [M79.89] Other pulmonary embolism without acute cor pulmonale, unspecified chronicity (HCC) [I26.99]  DISCHARGE DIAGNOSIS:  Active Problems:   Bilateral pulmonary embolism (HCC)   Pressure injury of skin   Prostate cancer [facility])   Palliative care by specialist   Advance care planning   SECONDARY DIAGNOSIS:   Past Medical History:  Diagnosis Date  . Arthritis   . Hepatitis C   . Hyperlipidemia   . Hypertension   . Prostate cancer ([place]) 12/2016  . Stroke [facility])    w left sided weakness per pt (2017)     ADMITTING HISTORY  HISTORY OF PRESENT ILLNESS:  Francisco Lowe  is a 71 y.o. 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 Patient has metastatic prostate cancer and has just completed chemotherapy. Patient denies any chest pain but reporting shortness of breath with minimal exertion. Right lower extremity is swollen but denies any redness or trauma. Patient's sister, daughter and other significant family members are at [place]:    * Acute hypoxic respiratory failure * Bilateral pulmonary emboli with extensive right lower extremity DVT * Bilateral pneumonia in immunocompromised patient * Prostate cancer with metastasis * Severe sepsis * Hypokalemia * Anemia of chronic disease * Severe protein calorie malnutrition  Treated with  broad-spectrum IV antibiotics, therapeutic lovenox but Continued to worsen. Due to no improvement inspite of aggressive care patient family decided to de escalate care and shift to comfort measures Morphine and Ativan PRN  Palliative care has seen the patient  Discharge home with hospice for end of life care  CONSULTS OBTAINED:    DRUG ALLERGIES:  No Known Allergies  DISCHARGE MEDICATIONS:   Current Discharge Medication List    START taking these medications   Details  Morphine Sulfate (MORPHINE CONCENTRATE) 10 mg / 0.5 ml concentrated solution Take 0.5 mLs (10 mg total) by mouth every 3 (three) hours as needed for severe pain or shortness of breath. Qty: 30 mL, Refills: 0      CONTINUE these medications which have CHANGED   Details  LORazepam (ATIVAN) 0.5 MG tablet Take 1 tablet (0.5 mg total) by mouth every 4 (four) hours as needed for anxiety or sleep (Nausea or vomiting). Qty: 30 tablet, Refills: 0    ondansetron (ZOFRAN) 8 MG tablet Take 1 tablet (8 mg total) by mouth every 8 (eight) hours as needed for refractory nausea / vomiting. Qty: 30 tablet, Refills: 0   Associated Diagnoses: Prostate cancer metastatic to bone ([place])      CONTINUE these medications which have NOT CHANGED   Details  prochlorperazine (COMPAZINE) 10 MG tablet Take 1 tablet (10 mg total) by mouth every 6 (six) hours as needed (Nausea or vomiting). Qty: 30 tablet, Refills: 1   Associated Diagnoses: Prostate cancer metastatic to bone ([place])      STOP taking these medications     aspirin EC 81 MG  tablet      atorvastatin (LIPITOR) 40 MG tablet      cyclobenzaprine (FLEXERIL) 10 MG tablet      DULoxetine (CYMBALTA) 30 MG capsule      gabapentin (NEURONTIN) 300 MG capsule      HYDROcodone-acetaminophen (NORCO) 7.5-325 MG tablet      lidocaine-prilocaine (EMLA) cream      lisinopril (PRINIVIL,ZESTRIL) 5 MG tablet      megestrol (MEGACE) 400 MG/10ML suspension      mirtazapine (REMERON) 15  MG tablet      sertraline (ZOLOFT) 25 MG tablet      fluconazole (DIFLUCAN) 100 MG tablet         Today   VITAL SIGNS:  Blood pressure 117/76, pulse 94, temperature 97.8 F (36.6 C), temperature source Oral, resp. rate 16, height 5\' 11"  (1.803 m), weight 63 kg (139 lb), SpO2 100 %.  I/O:   Intake/Output Summary (Last 24 hours) at 06/01/17 0909 Last data filed at 06/01/17 0700  Gross per 24 hour  Intake               85 ml  Output              100 ml  Net              -15 ml    PHYSICAL EXAMINATION:  Physical Exam  GENERAL:  71 y.o.-year-old patient lying in the bed with resp distress LUNGS: Coarse breath sounds CARDIOVASCULAR: S1, S2 normal. No murmurs, rubs, or gallops.  NEUROLOGIC: Moves all 4 extremities. PSYCHIATRIC: The patient is drowzy  DATA REVIEW:   CBC  Recent Labs Lab 05/30/17 0457  WBC 28.9*  HGB 10.3*  HCT 32.2*  PLT 276    Chemistries   Recent Labs Lab 05/29/17 0945 05/30/17 0457  NA 149* 142  K 3.0* 3.1*  CL 114* 114*  CO2 23 21*  GLUCOSE 122* 124*  BUN 45* 27*  CREATININE 0.87 0.70  CALCIUM 8.5* 7.9*  AST 32  --   ALT 22  --   ALKPHOS 93  --   BILITOT 1.1  --     Cardiac Enzymes No results for input(s): TROPONINI in the last 168 hours.  Microbiology Results  Results for orders placed or performed during the hospital encounter of 05/29/17  Blood culture (routine x 2)     Status: None (Preliminary result)   Collection Time: 05/29/17  9:54 AM  Result Value Ref Range Status   Specimen Description BLOOD LEFT ANTECUBITAL  Final   Special Requests   Final    BOTTLES DRAWN AEROBIC AND ANAEROBIC Blood Culture adequate volume   Culture NO GROWTH 3 DAYS  Final   Report Status PENDING  Incomplete  Blood culture (routine x 2)     Status: None (Preliminary result)   Collection Time: 05/29/17  9:56 AM  Result Value Ref Range Status   Specimen Description BLOOD RIGHT ANTECUBITAL  Final   Special Requests   Final    BOTTLES DRAWN  AEROBIC AND ANAEROBIC Blood Culture adequate volume   Culture NO GROWTH 3 DAYS  Final   Report Status PENDING  Incomplete  MRSA PCR Screening     Status: None   Collection Time: 05/30/17 11:23 AM  Result Value Ref Range Status   MRSA by PCR NEGATIVE NEGATIVE Final    Comment:        The GeneXpert MRSA Assay (FDA approved for NASAL specimens only), is one component of a comprehensive  MRSA colonization surveillance program. It is not intended to diagnose MRSA infection nor to guide or monitor treatment for MRSA infections.     RADIOLOGY:  No results found.  Follow up with PCP in 1 week.  Management plans discussed with the patient, family and they are in agreement.  CODE STATUS:     Code Status Orders        Start     Ordered   05/30/17 0906  Do not attempt resuscitation (DNR)  Continuous    Question Answer Comment  In the event of cardiac or respiratory ARREST Do not call a "code blue"   In the event of cardiac or respiratory ARREST Do not perform Intubation, CPR, defibrillation or ACLS   In the event of cardiac or respiratory ARREST Use medication by any route, position, wound care, and other measures to relive pain and suffering. May use oxygen, suction and manual treatment of airway obstruction as needed for comfort.      05/30/17 0347    Code Status History    Date Active Date Inactive Code Status Order ID Comments User Context   05/29/2017  2:04 PM 05/30/2017  9:05 AM Full Code [id number]  Nicholes Mango, MD Inpatient   05/29/2017  1:42 PM 05/29/2017  2:04 PM Full Code [id number]  Nicholes Mango, MD ED      TOTAL TIME TAKING CARE OF THIS PATIENT ON DAY OF DISCHARGE: more than 30 minutes.   Hillary Bow R M.D on 06/01/2017 at 9:09 AM  Between 7am to 6pm - Pager - [phone]  After 6pm go to [email or web address] - password [email or web address] [facility]  Office  [phone]  CC: Primary care physician; Dion Body, MD  Note: This dictation was  prepared with Dragon dictation along with smaller phrase technology. Any transcriptional errors that result from this process are unintentional.

## 2017-06-01 NOTE — Progress Notes (Signed)
New referral for [facility] services at home received from [facility] following a palliative medicine consult. Mr. Ferrari is a 71 year old man with a known history of Stage IV prostate cancer (mets to the bone) with last treatment on 8/2, HLD, HTN, stroke with left sided weakness, admitted on 8/25 with increasing dyspnea. ED work up revealed bilateral pulmonary embolus, multifocal pneumonia with sepsis. He has continued to decline despite medical interventions, requiring [place] oxygen. Family met with Palliative NP Mariana Kaufman on 8/27 and have chosen to focus on comfort with the support of hospice services in their home. Writer met in the room this morning with patient's wife Caren Griffins and daughter Darden Dates to initiate education regarding hospice services, philosophy and team approach to care with good understanding voiced. DME requested includes a hospital bed and oxygen 10 liter concentrator. This will be delivered today with planned discharge via EMS when equipment in in place. Signed DNR in place in patient's discharge packet. Patient also to discharge with a prescription for liquid morphine and lorazepam per discussion with attending physician Dr. Darvin Neighbours. Hospital care team all aware and in agreement with discharge plan. Discharge summary faxed to referral. Thank you. Flo Shanks RN, BSN, [facility], hospital liaison [phone] c

## 2017-06-01 NOTE — Progress Notes (Signed)
Received MD order to discharge patient to home with Hospice received  discharge instructions prescriptions and home meds with patient and family at both verbalized understanding

## 2017-06-02 ENCOUNTER — Telehealth: Payer: Self-pay | Admitting: *Deleted

## 2017-06-02 MED ORDER — MORPHINE SULFATE ER 15 MG PO TBCR: 15.0000 mg | EXTENDED_RELEASE_TABLET | Freq: Two times a day (BID) | ORAL | 0 refills | Status: AC

## 2017-06-02 MED ORDER — MORPHINE SULFATE (CONCENTRATE) 10 MG /0.5 ML PO SOLN: ORAL | 0 refills | Status: AC

## 2017-06-02 NOTE — Telephone Encounter (Addendum)
Per PO Dr Janese Banks, MS ER 15 mg twice a day and increase Roxanol to 20 mg every 1 - 2 hours as needed. Sarah informed and Prescription for MS ER faxed to [facility] she reports patient just picked up prescription fdor raxanol today

## 2017-06-02 NOTE — Telephone Encounter (Signed)
Sarah from hospice called to rpeort that the current pain medicine of Roxanol 10 mg every 3 hours is not controlling pain. Asking for an increase in dose and frequency also to add MS Contin. Wants order for Senna as well. Please advise

## 2017-06-03 ENCOUNTER — Other Ambulatory Visit: Payer: Self-pay

## 2017-06-03 ENCOUNTER — Inpatient Hospital Stay: Payer: Medicare Other

## 2017-06-03 ENCOUNTER — Inpatient Hospital Stay: Payer: Medicare Other | Admitting: Oncology

## 2017-06-03 DIAGNOSIS — C7951 Secondary malignant neoplasm of bone: Principal | ICD-10-CM

## 2017-06-03 DIAGNOSIS — C61 Malignant neoplasm of prostate: Secondary | ICD-10-CM

## 2017-06-03 LAB — CULTURE, BLOOD (ROUTINE X 2)
CULTURE: NO GROWTH
Culture: NO GROWTH
Special Requests: ADEQUATE
Special Requests: ADEQUATE

## 2017-06-04 ENCOUNTER — Telehealth: Payer: Self-pay | Admitting: Oncology

## 2017-06-04 NOTE — Telephone Encounter (Signed)
Patient is in [facility] & will not be returning for follow up, per Sherry/Dr Janese Banks. MF

## 2017-06-08 ENCOUNTER — Telehealth: Payer: Self-pay | Admitting: *Deleted

## 2017-06-08 NOTE — Telephone Encounter (Signed)
Can you please draft a letter? That he had metastatic prostate cancer and he completed chemotherapy recently. Recently admitted for HCAP and PE and was critically ill. Chose not to escalate care and is currently under hospice care for prostate cancer and acute hypoxic respiratory failure due to pneumonia and PE. His prognosis likely in weeks- months

## 2017-06-08 NOTE — Telephone Encounter (Signed)
Caren Griffins called stating that the government is requesting a letter on  letterhead stating that Francisco Lowe is terminal for him to be able to have access to his benefits. Please call her back when ready [phone]

## 2017-06-09 ENCOUNTER — Telehealth: Payer: Self-pay | Admitting: *Deleted

## 2017-06-09 ENCOUNTER — Encounter: Payer: Self-pay | Admitting: *Deleted

## 2017-07-05 NOTE — Telephone Encounter (Signed)
Time of death is 3:17 PM

## 2017-07-05 NOTE — Telephone Encounter (Signed)
Pt's family called back about letter and I told her it is done but Dr. Janese Banks off  Today and will sign it tom.  Family- cynthis Mukai wants it faxed to [phone].  She would also want a copy of letter mailed to her.  [place], [place]

## 2017-07-05 NOTE — Telephone Encounter (Signed)
Received a call form Hospice that patient expired this afternoon. Did not leave time of death, but I have left a message for her to call me back with the time

## 2017-07-05 NOTE — Telephone Encounter (Signed)
-----   Message from Elouise Munroe sent at 06/25/2017  1:15 PM EDT ----- Contact: [phone] Please call -she wants to come pick up [facility] terminally ill paper-thx

## 2017-07-05 NOTE — Telephone Encounter (Signed)
Written, awaiting physician Signature

## 2017-07-05 DEATH — deceased

## 2017-07-16 ENCOUNTER — Other Ambulatory Visit: Payer: Self-pay | Admitting: Nurse Practitioner

## 2017-09-01 ENCOUNTER — Ambulatory Visit: Payer: Medicare Other | Admitting: Urology

## 2018-03-13 IMAGING — CT CT CHEST W/ CM
1 series · 15 of 34 positions shown, 19 images · IV contrast (iopamidol)
Comparison: Bone scan 12/11/2016 and CT abdomen pelvis 12/11/2016.

CLINICAL DATA: Prostate cancer.

EXAM:
CT CHEST WITH CONTRAST
TECHNIQUE: Multidetector CT imaging of the chest was performed during
intravenous contrast administration.
CONTRAST:  75mL [id number] IOPAMIDOL ([id number]-TVV) INJECTION 61%

[Series 2: axial st · axial · 0.71mm/px · z∈[-642,-366]mm · 15 of 163 slices shown, 19 images]
[im 13/163  mediastinal]
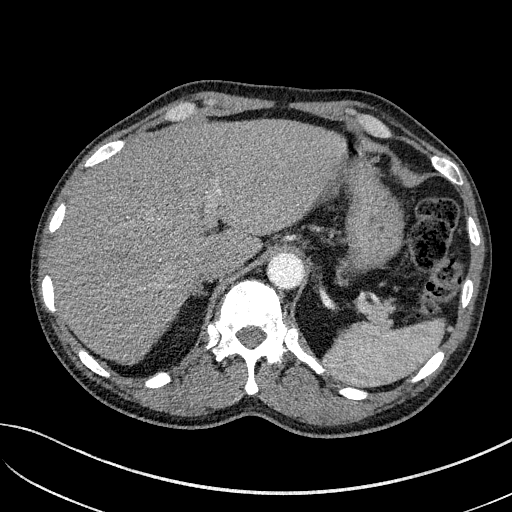
[im 13/163  lung]
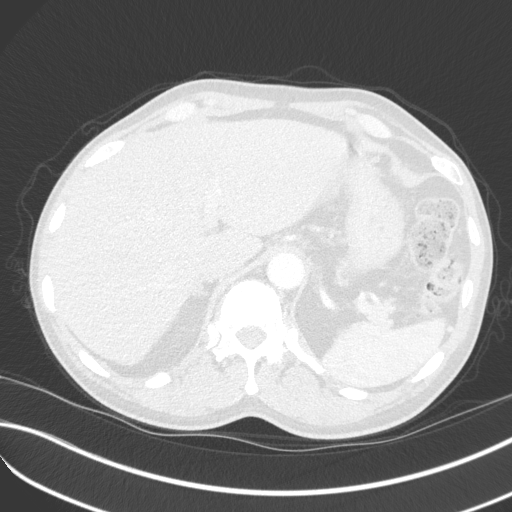
[im 25/163  lung]
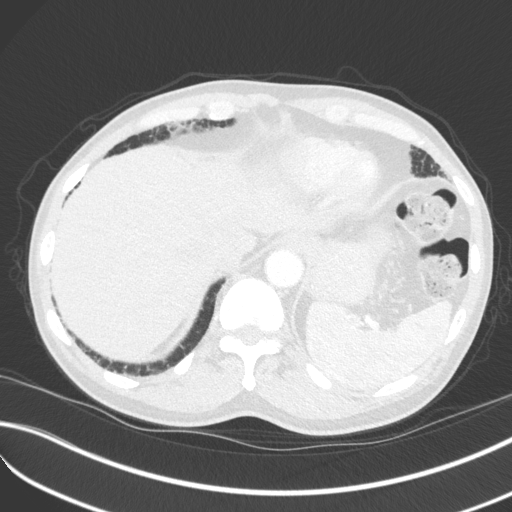
[im 33/163  lung]
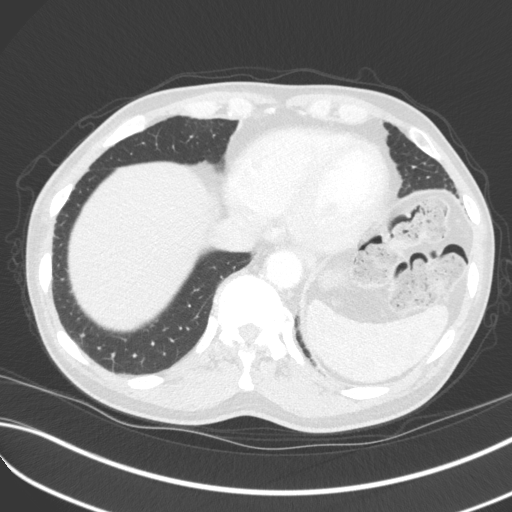
[im 43/163  lung]
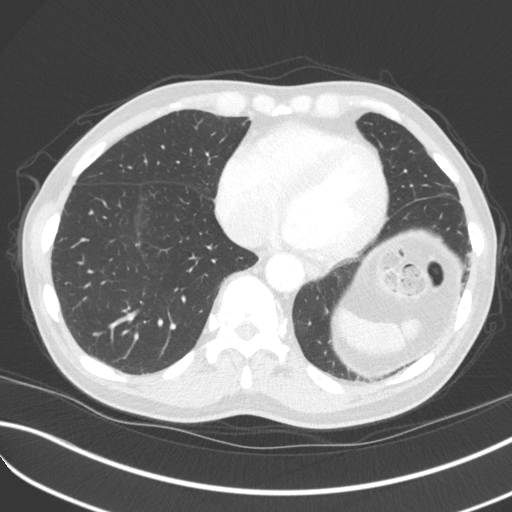
[im 55/163  mediastinal]
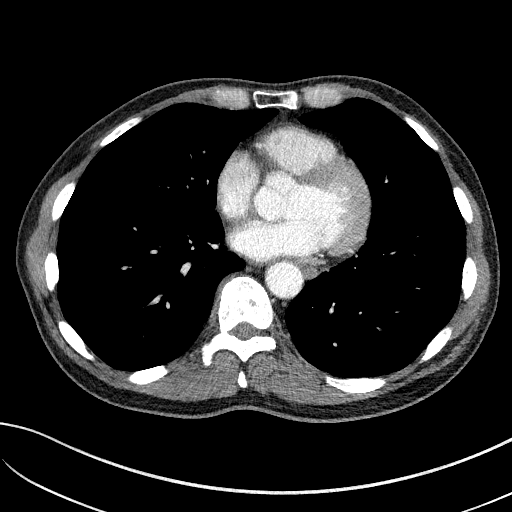
[im 55/163  lung]
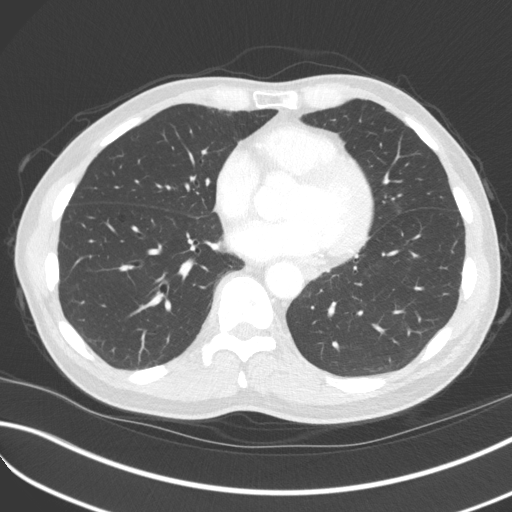
[im 65/163  lung]
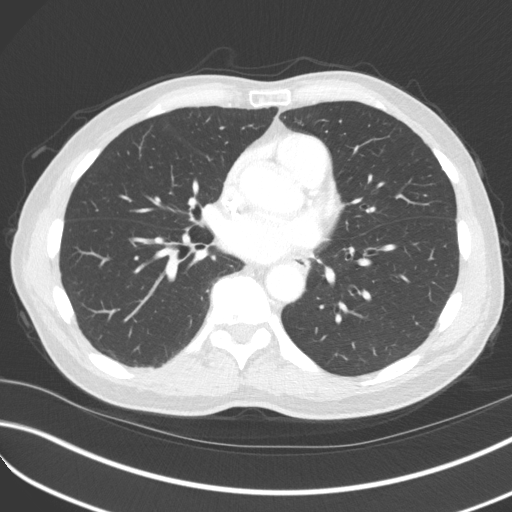
[im 73/163  lung]
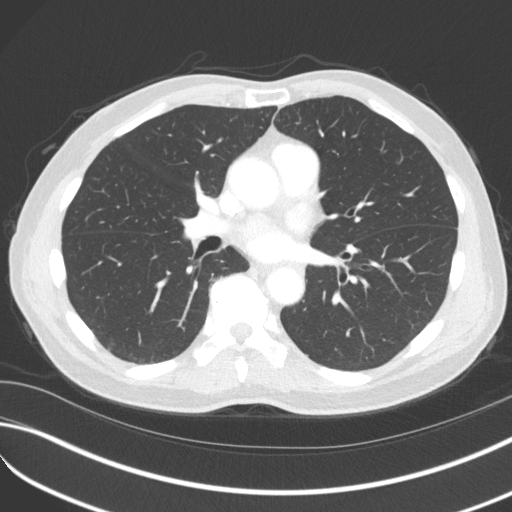
[im 85/163  lung]
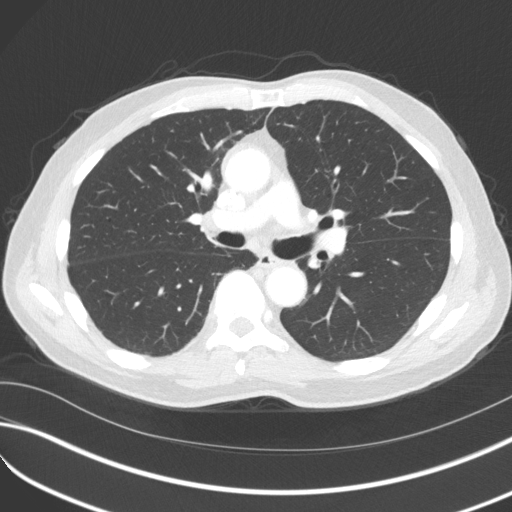
[im 91/163  mediastinal]
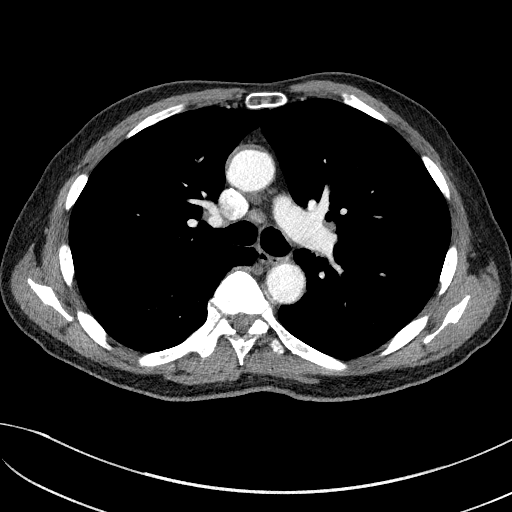
[im 91/163  lung]
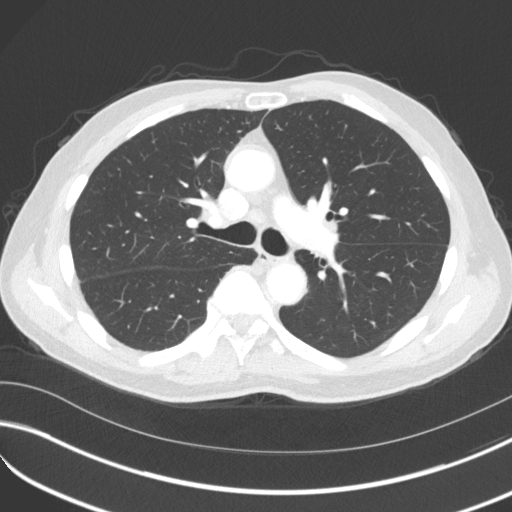
[im 98/163  lung]
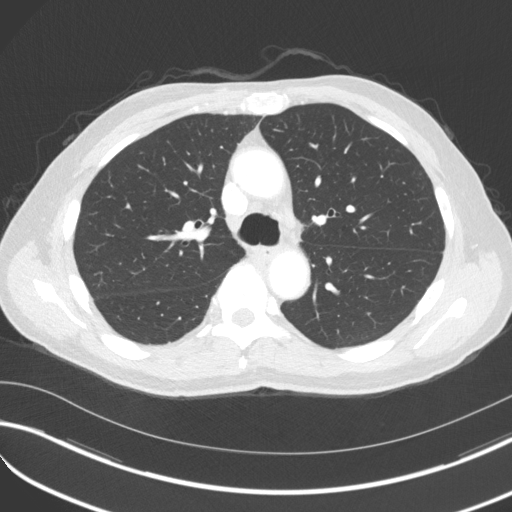
[im 109/163  lung]
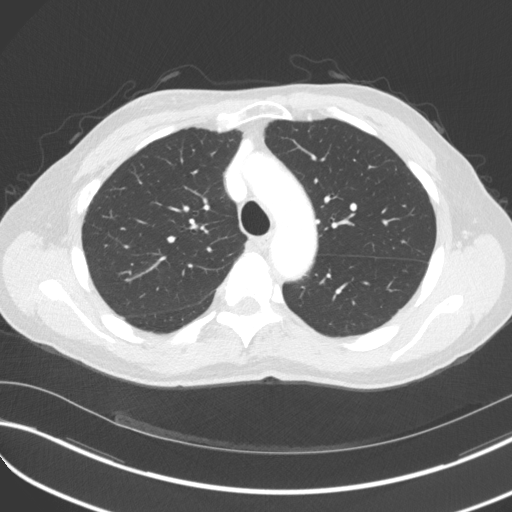
[im 121/163  lung]
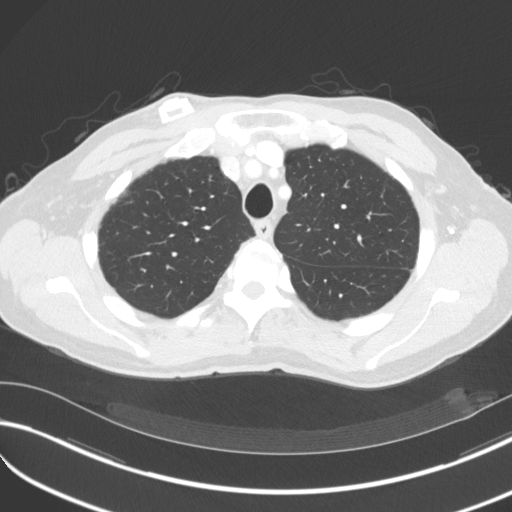
[im 130/163  mediastinal]
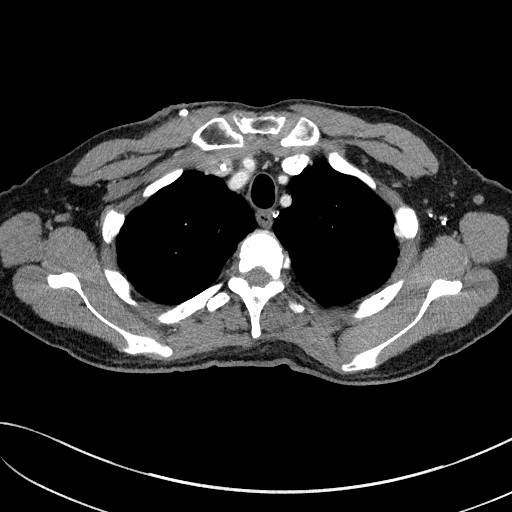
[im 130/163  lung]
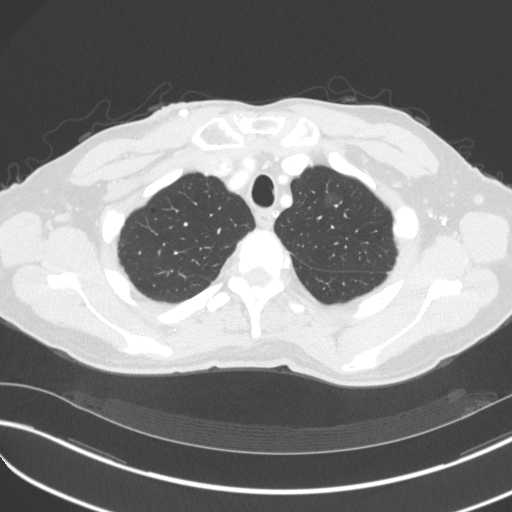
[im 139/163  lung]
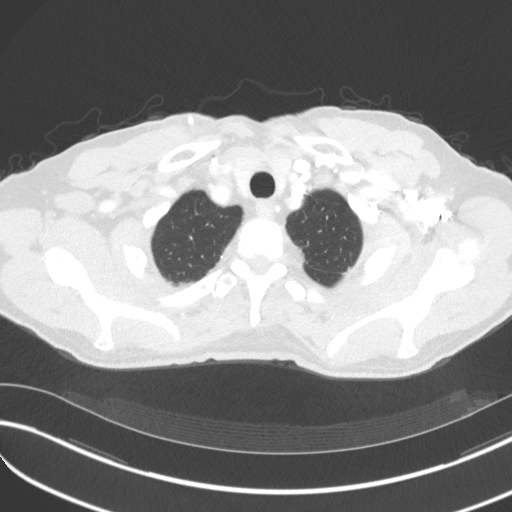
[im 151/163  lung]
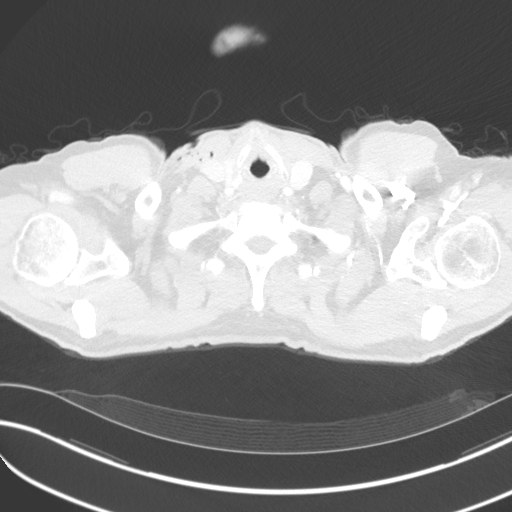

[15 of 34 positions shown; findings below may reference images not displayed]

FINDINGS: Cardiovascular: There is subcutaneous air in the right
infraclavicular region related to recent placement of a right IJ
Port-A-Cath which terminates at the SVC RA junction. Atherosclerotic
calcification of the arterial vasculature, including coronary
arteries. Heart size normal. No pericardial effusion.

Mediastinum/Nodes: Mediastinal and hilar lymph nodes are not
enlarged by CT size criteria. No axillary adenopathy. Esophagus is
grossly unremarkable.

Lungs/Pleura: Minimal biapical pleuroparenchymal scarring. Lungs are
clear. No pleural fluid. Airway is unremarkable.

Upper Abdomen: Visualized portions of the liver, gallbladder,
adrenal glands, kidneys, spleen, pancreas, stomach and bowel are
grossly unremarkable. No upper abdominal adenopathy.

Musculoskeletal: Mottled sclerosis is seen throughout the visualized
osseous structures.
IMPRESSION: 1. No pulmonary parenchymal metastatic disease. Diffuse osseous
metastatic disease.
2. Aortic atherosclerosis ([id number]). Coronary artery
calcification.
